# Patient Record
Sex: Male | Born: 1935 | Race: Black or African American | Hispanic: No
Health system: Southern US, Community
[De-identification: ages and names within clinical notes are randomized; demographics above are authoritative.]

## PROBLEM LIST (undated history)

## (undated) DIAGNOSIS — I739 Peripheral vascular disease, unspecified: Secondary | ICD-10-CM

## (undated) DIAGNOSIS — K5730 Diverticulosis of large intestine without perforation or abscess without bleeding: Secondary | ICD-10-CM

## (undated) DIAGNOSIS — N189 Chronic kidney disease, unspecified: Secondary | ICD-10-CM

## (undated) DIAGNOSIS — I639 Cerebral infarction, unspecified: Secondary | ICD-10-CM

## (undated) DIAGNOSIS — Z86010 Personal history of colonic polyps: Secondary | ICD-10-CM

## (undated) DIAGNOSIS — N400 Enlarged prostate without lower urinary tract symptoms: Secondary | ICD-10-CM

## (undated) DIAGNOSIS — I10 Essential (primary) hypertension: Secondary | ICD-10-CM

## (undated) DIAGNOSIS — I4891 Unspecified atrial fibrillation: Secondary | ICD-10-CM

## (undated) DIAGNOSIS — I2510 Atherosclerotic heart disease of native coronary artery without angina pectoris: Secondary | ICD-10-CM

## (undated) DIAGNOSIS — Z860101 Personal history of adenomatous and serrated colon polyps: Secondary | ICD-10-CM

## (undated) DIAGNOSIS — E785 Hyperlipidemia, unspecified: Secondary | ICD-10-CM

## (undated) DIAGNOSIS — I255 Ischemic cardiomyopathy: Secondary | ICD-10-CM

## (undated) DIAGNOSIS — I219 Acute myocardial infarction, unspecified: Secondary | ICD-10-CM

## (undated) DIAGNOSIS — L0390 Cellulitis, unspecified: Secondary | ICD-10-CM

## (undated) DIAGNOSIS — I872 Venous insufficiency (chronic) (peripheral): Secondary | ICD-10-CM

## (undated) DIAGNOSIS — I509 Heart failure, unspecified: Secondary | ICD-10-CM

## (undated) HISTORY — DX: Personal history of adenomatous and serrated colon polyps: Z86.0101

## (undated) HISTORY — DX: Venous insufficiency (chronic) (peripheral): I87.2

## (undated) HISTORY — DX: Ischemic cardiomyopathy: I25.5

## (undated) HISTORY — DX: Heart failure, unspecified: I50.9

## (undated) HISTORY — DX: Unspecified atrial fibrillation: I48.91

## (undated) HISTORY — DX: Diverticulosis of large intestine without perforation or abscess without bleeding: K57.30

## (undated) HISTORY — DX: Cellulitis, unspecified: L03.90

## (undated) HISTORY — DX: Essential (primary) hypertension: I10

## (undated) HISTORY — DX: Hyperlipidemia, unspecified: E78.5

## (undated) HISTORY — DX: Peripheral vascular disease, unspecified: I73.9

## (undated) HISTORY — DX: Acute myocardial infarction, unspecified: I21.9

## (undated) HISTORY — DX: Atherosclerotic heart disease of native coronary artery without angina pectoris: I25.10

## (undated) HISTORY — DX: Benign prostatic hyperplasia without lower urinary tract symptoms: N40.0

## (undated) HISTORY — DX: Personal history of colonic polyps: Z86.010

## (undated) HISTORY — DX: Chronic kidney disease, unspecified: N18.9

## (undated) HISTORY — PX: OTHER SURGICAL HISTORY: SHX169

## (undated) HISTORY — DX: Cerebral infarction, unspecified: I63.9

---

## 1987-07-21 HISTORY — PX: OTHER SURGICAL HISTORY: SHX169

## 2000-12-20 ENCOUNTER — Encounter: Payer: Self-pay | Admitting: Family Medicine

## 2000-12-20 ENCOUNTER — Encounter: Admission: RE | Admit: 2000-12-20 | Discharge: 2000-12-20 | Payer: Self-pay | Admitting: Family Medicine

## 2001-01-28 ENCOUNTER — Encounter: Admission: RE | Admit: 2001-01-28 | Discharge: 2001-01-28 | Payer: Self-pay | Admitting: Family Medicine

## 2001-01-28 ENCOUNTER — Encounter: Payer: Self-pay | Admitting: Family Medicine

## 2001-07-30 ENCOUNTER — Encounter: Payer: Self-pay | Admitting: Cardiology

## 2001-07-30 ENCOUNTER — Inpatient Hospital Stay (HOSPITAL_COMMUNITY): Admission: EM | Admit: 2001-07-30 | Discharge: 2001-08-02 | Payer: Self-pay | Admitting: Emergency Medicine

## 2001-08-01 ENCOUNTER — Encounter: Payer: Self-pay | Admitting: Internal Medicine

## 2002-12-26 ENCOUNTER — Encounter (INDEPENDENT_AMBULATORY_CARE_PROVIDER_SITE_OTHER): Payer: Self-pay | Admitting: Gastroenterology

## 2002-12-26 DIAGNOSIS — D126 Benign neoplasm of colon, unspecified: Secondary | ICD-10-CM | POA: Insufficient documentation

## 2002-12-26 DIAGNOSIS — K5730 Diverticulosis of large intestine without perforation or abscess without bleeding: Secondary | ICD-10-CM | POA: Insufficient documentation

## 2003-06-01 ENCOUNTER — Ambulatory Visit (HOSPITAL_COMMUNITY): Admission: RE | Admit: 2003-06-01 | Discharge: 2003-06-01 | Payer: Self-pay | Admitting: Family Medicine

## 2005-01-26 ENCOUNTER — Ambulatory Visit: Payer: Self-pay | Admitting: Gastroenterology

## 2005-02-05 ENCOUNTER — Ambulatory Visit: Payer: Self-pay | Admitting: Cardiology

## 2005-02-18 ENCOUNTER — Ambulatory Visit: Payer: Self-pay | Admitting: Cardiology

## 2005-05-25 ENCOUNTER — Inpatient Hospital Stay (HOSPITAL_COMMUNITY): Admission: EM | Admit: 2005-05-25 | Discharge: 2005-06-04 | Payer: Self-pay | Admitting: Emergency Medicine

## 2005-05-28 ENCOUNTER — Ambulatory Visit: Payer: Self-pay | Admitting: *Deleted

## 2005-06-04 ENCOUNTER — Inpatient Hospital Stay: Admission: RE | Admit: 2005-06-04 | Discharge: 2005-06-16 | Payer: Self-pay | Admitting: Cardiology

## 2005-06-24 ENCOUNTER — Ambulatory Visit: Payer: Self-pay | Admitting: *Deleted

## 2005-06-29 ENCOUNTER — Ambulatory Visit: Payer: Self-pay | Admitting: Cardiology

## 2005-07-02 ENCOUNTER — Ambulatory Visit: Payer: Self-pay | Admitting: *Deleted

## 2005-07-09 ENCOUNTER — Ambulatory Visit: Payer: Self-pay | Admitting: Cardiology

## 2005-07-17 ENCOUNTER — Ambulatory Visit: Payer: Self-pay | Admitting: Cardiology

## 2005-07-31 ENCOUNTER — Ambulatory Visit: Payer: Self-pay | Admitting: Internal Medicine

## 2005-08-14 ENCOUNTER — Ambulatory Visit: Payer: Self-pay | Admitting: Cardiology

## 2005-09-04 ENCOUNTER — Ambulatory Visit: Payer: Self-pay | Admitting: Cardiovascular Disease

## 2005-09-16 ENCOUNTER — Ambulatory Visit: Payer: Self-pay | Admitting: Cardiology

## 2005-09-16 ENCOUNTER — Ambulatory Visit: Payer: Self-pay | Admitting: Internal Medicine

## 2005-09-23 ENCOUNTER — Ambulatory Visit: Payer: Self-pay | Admitting: Internal Medicine

## 2005-10-07 ENCOUNTER — Ambulatory Visit: Payer: Self-pay | Admitting: Cardiology

## 2005-11-04 ENCOUNTER — Ambulatory Visit: Payer: Self-pay | Admitting: *Deleted

## 2005-12-02 ENCOUNTER — Ambulatory Visit: Payer: Self-pay | Admitting: Cardiology

## 2005-12-30 ENCOUNTER — Ambulatory Visit: Payer: Self-pay | Admitting: Cardiovascular Disease

## 2006-01-27 ENCOUNTER — Ambulatory Visit: Payer: Self-pay | Admitting: Internal Medicine

## 2006-02-24 ENCOUNTER — Ambulatory Visit: Payer: Self-pay | Admitting: Cardiology

## 2006-03-18 ENCOUNTER — Ambulatory Visit: Payer: Self-pay | Admitting: Cardiology

## 2006-03-24 ENCOUNTER — Ambulatory Visit: Payer: Self-pay | Admitting: *Deleted

## 2006-04-01 ENCOUNTER — Encounter: Payer: Self-pay | Admitting: Cardiology

## 2006-04-01 ENCOUNTER — Encounter: Payer: Self-pay | Admitting: Internal Medicine

## 2006-04-01 ENCOUNTER — Ambulatory Visit: Payer: Self-pay

## 2006-04-21 ENCOUNTER — Ambulatory Visit: Payer: Self-pay | Admitting: Internal Medicine

## 2006-05-05 ENCOUNTER — Ambulatory Visit: Payer: Self-pay | Admitting: *Deleted

## 2006-06-01 ENCOUNTER — Ambulatory Visit: Payer: Self-pay | Admitting: *Deleted

## 2006-06-03 ENCOUNTER — Ambulatory Visit: Payer: Self-pay | Admitting: Cardiology

## 2006-06-04 ENCOUNTER — Ambulatory Visit: Payer: Self-pay | Admitting: Cardiovascular Disease

## 2006-06-09 ENCOUNTER — Ambulatory Visit: Payer: Self-pay | Admitting: Cardiology

## 2006-06-16 ENCOUNTER — Ambulatory Visit: Payer: Self-pay | Admitting: Cardiovascular Disease

## 2006-06-22 ENCOUNTER — Ambulatory Visit: Payer: Self-pay | Admitting: Cardiology

## 2006-07-01 ENCOUNTER — Ambulatory Visit: Payer: Self-pay | Admitting: Cardiology

## 2006-07-06 ENCOUNTER — Ambulatory Visit: Payer: Self-pay | Admitting: Cardiology

## 2006-07-21 ENCOUNTER — Ambulatory Visit: Payer: Self-pay | Admitting: *Deleted

## 2006-08-02 ENCOUNTER — Emergency Department (HOSPITAL_COMMUNITY): Admission: EM | Admit: 2006-08-02 | Discharge: 2006-08-02 | Payer: Self-pay | Admitting: Emergency Medicine

## 2006-08-09 ENCOUNTER — Ambulatory Visit: Payer: Self-pay | Admitting: Cardiology

## 2006-08-18 ENCOUNTER — Ambulatory Visit: Payer: Self-pay | Admitting: Internal Medicine

## 2006-08-30 ENCOUNTER — Ambulatory Visit: Payer: Self-pay | Admitting: Cardiology

## 2006-09-07 ENCOUNTER — Encounter: Admission: RE | Admit: 2006-09-07 | Discharge: 2006-09-07 | Payer: Self-pay | Admitting: Family Medicine

## 2006-09-17 ENCOUNTER — Ambulatory Visit: Payer: Self-pay | Admitting: Cardiology

## 2006-09-27 ENCOUNTER — Ambulatory Visit: Payer: Self-pay | Admitting: Cardiology

## 2006-10-18 ENCOUNTER — Ambulatory Visit: Payer: Self-pay | Admitting: Cardiovascular Disease

## 2006-11-04 ENCOUNTER — Ambulatory Visit: Payer: Self-pay | Admitting: Cardiology

## 2006-11-04 ENCOUNTER — Ambulatory Visit: Payer: Self-pay | Admitting: Internal Medicine

## 2006-11-11 ENCOUNTER — Ambulatory Visit: Payer: Self-pay

## 2006-11-18 ENCOUNTER — Ambulatory Visit: Payer: Self-pay | Admitting: Cardiovascular Disease

## 2006-12-01 ENCOUNTER — Ambulatory Visit: Payer: Self-pay | Admitting: Internal Medicine

## 2006-12-16 ENCOUNTER — Ambulatory Visit: Payer: Self-pay | Admitting: *Deleted

## 2006-12-30 ENCOUNTER — Ambulatory Visit: Payer: Self-pay | Admitting: Cardiology

## 2007-01-10 ENCOUNTER — Ambulatory Visit: Payer: Self-pay | Admitting: Internal Medicine

## 2007-01-20 ENCOUNTER — Ambulatory Visit: Payer: Self-pay | Admitting: Internal Medicine

## 2007-02-03 ENCOUNTER — Ambulatory Visit: Payer: Self-pay | Admitting: Cardiology

## 2007-02-24 ENCOUNTER — Ambulatory Visit: Payer: Self-pay | Admitting: Cardiovascular Disease

## 2007-03-10 ENCOUNTER — Ambulatory Visit: Payer: Self-pay | Admitting: Cardiology

## 2007-03-31 ENCOUNTER — Ambulatory Visit: Payer: Self-pay | Admitting: Cardiology

## 2007-04-20 ENCOUNTER — Ambulatory Visit: Payer: Self-pay | Admitting: Cardiology

## 2007-04-28 ENCOUNTER — Ambulatory Visit: Payer: Self-pay | Admitting: Cardiology

## 2007-04-28 LAB — CONVERTED CEMR LAB
BUN: 15 mg/dL (ref 6–23)
Basophils Absolute: 0 10*3/uL (ref 0.0–0.1)
Basophils Relative: 0.6 % (ref 0.0–1.0)
CO2: 30 meq/L (ref 19–32)
Calcium: 9.2 mg/dL (ref 8.4–10.5)
Chloride: 107 meq/L (ref 96–112)
Cholesterol: 146 mg/dL (ref 0–200)
Creatinine, Ser: 1.6 mg/dL — ABNORMAL HIGH (ref 0.4–1.5)
Eosinophils Absolute: 0.3 10*3/uL (ref 0.0–0.6)
Eosinophils Relative: 3.8 % (ref 0.0–5.0)
GFR calc Af Amer: 54 mL/min
GFR calc non Af Amer: 45 mL/min
Glucose, Bld: 97 mg/dL (ref 70–99)
HCT: 42.5 % (ref 39.0–52.0)
HDL: 55 mg/dL (ref >39.0)
Hemoglobin: 14.2 g/dL (ref 13.0–17.0)
LDL Cholesterol: 78 mg/dL (ref 0–99)
Lymphocytes Relative: 30.1 % (ref 12.0–46.0)
MCHC: 33.3 g/dL (ref 30.0–36.0)
MCV: 84.1 fL (ref 78.0–100.0)
Monocytes Absolute: 0.7 10*3/uL (ref 0.2–0.7)
Monocytes Relative: 10.3 % (ref 3.0–11.0)
Neutro Abs: 4 10*3/uL (ref 1.4–7.7)
Neutrophils Relative %: 55.2 % (ref 43.0–77.0)
Platelets: 170 10*3/uL (ref 150–400)
Potassium: 3.8 meq/L (ref 3.5–5.1)
RBC: 5.06 M/uL (ref 4.22–5.81)
RDW: 13.3 % (ref 11.5–14.6)
Sodium: 143 meq/L (ref 135–145)
TSH: 0.96 microintl units/mL (ref 0.35–5.50)
Total CHOL/HDL Ratio: 2.7
Triglycerides: 67 mg/dL (ref 0–149)
VLDL: 13 mg/dL (ref 0–40)
WBC: 7.1 10*3/uL (ref 4.5–10.5)

## 2007-05-11 ENCOUNTER — Ambulatory Visit: Payer: Self-pay | Admitting: Cardiology

## 2007-06-08 ENCOUNTER — Ambulatory Visit: Payer: Self-pay | Admitting: Cardiology

## 2007-07-06 ENCOUNTER — Ambulatory Visit: Payer: Self-pay | Admitting: Cardiovascular Disease

## 2007-08-03 ENCOUNTER — Ambulatory Visit: Payer: Self-pay | Admitting: Cardiovascular Disease

## 2007-08-31 ENCOUNTER — Ambulatory Visit: Payer: Self-pay | Admitting: Cardiology

## 2007-09-28 ENCOUNTER — Ambulatory Visit: Payer: Self-pay | Admitting: Cardiology

## 2007-10-20 ENCOUNTER — Ambulatory Visit: Payer: Self-pay | Admitting: Cardiology

## 2007-10-26 ENCOUNTER — Ambulatory Visit: Payer: Self-pay | Admitting: Cardiovascular Disease

## 2007-11-01 ENCOUNTER — Ambulatory Visit: Payer: Self-pay

## 2007-11-01 ENCOUNTER — Encounter: Payer: Self-pay | Admitting: Cardiology

## 2007-11-01 ENCOUNTER — Ambulatory Visit: Payer: Self-pay | Admitting: Cardiology

## 2007-11-01 LAB — CONVERTED CEMR LAB
ALT: 53 units/L (ref 0–53)
AST: 49 units/L — ABNORMAL HIGH (ref 0–37)
Albumin: 3.4 g/dL — ABNORMAL LOW (ref 3.5–5.2)
Alkaline Phosphatase: 75 units/L (ref 39–117)
BUN: 21 mg/dL (ref 6–23)
Basophils Absolute: 0 10*3/uL (ref 0.0–0.1)
Basophils Relative: 0.4 % (ref 0.0–1.0)
Bilirubin, Direct: 0.1 mg/dL (ref 0.0–0.3)
CO2: 33 meq/L — ABNORMAL HIGH (ref 19–32)
Calcium: 9.5 mg/dL (ref 8.4–10.5)
Chloride: 105 meq/L (ref 96–112)
Cholesterol: 141 mg/dL (ref 0–200)
Creatinine, Ser: 1.9 mg/dL — ABNORMAL HIGH (ref 0.4–1.5)
Eosinophils Absolute: 0.2 10*3/uL (ref 0.0–0.7)
Eosinophils Relative: 2.5 % (ref 0.0–5.0)
GFR calc Af Amer: 44 mL/min
GFR calc non Af Amer: 37 mL/min
Glucose, Bld: 96 mg/dL (ref 70–99)
HCT: 45.9 % (ref 39.0–52.0)
HDL: 55.6 mg/dL (ref >39.0)
Hemoglobin: 14.7 g/dL (ref 13.0–17.0)
LDL Cholesterol: 73 mg/dL (ref 0–99)
Lymphocytes Relative: 24.7 % (ref 12.0–46.0)
MCHC: 32.1 g/dL (ref 30.0–36.0)
MCV: 84.4 fL (ref 78.0–100.0)
Monocytes Absolute: 0.8 10*3/uL (ref 0.1–1.0)
Monocytes Relative: 9.5 % (ref 3.0–12.0)
Neutro Abs: 5.1 10*3/uL (ref 1.4–7.7)
Neutrophils Relative %: 62.9 % (ref 43.0–77.0)
Platelets: 160 10*3/uL (ref 150–400)
Potassium: 4.4 meq/L (ref 3.5–5.1)
Pro B Natriuretic peptide (BNP): 185 pg/mL — ABNORMAL HIGH (ref 0.0–100.0)
RBC: 5.44 M/uL (ref 4.22–5.81)
RDW: 13.3 % (ref 11.5–14.6)
Sodium: 142 meq/L (ref 135–145)
TSH: 0.68 microintl units/mL (ref 0.35–5.50)
Total Bilirubin: 0.7 mg/dL (ref 0.3–1.2)
Total CHOL/HDL Ratio: 2.5
Total Protein: 7.4 g/dL (ref 6.0–8.3)
Triglycerides: 61 mg/dL (ref 0–149)
VLDL: 12 mg/dL (ref 0–40)
WBC: 8.1 10*3/uL (ref 4.5–10.5)

## 2007-11-11 DIAGNOSIS — I872 Venous insufficiency (chronic) (peripheral): Secondary | ICD-10-CM | POA: Insufficient documentation

## 2007-11-11 DIAGNOSIS — Z8679 Personal history of other diseases of the circulatory system: Secondary | ICD-10-CM

## 2007-11-11 DIAGNOSIS — IMO0002 Reserved for concepts with insufficient information to code with codable children: Secondary | ICD-10-CM | POA: Insufficient documentation

## 2007-11-11 DIAGNOSIS — E785 Hyperlipidemia, unspecified: Secondary | ICD-10-CM

## 2007-11-11 DIAGNOSIS — I10 Essential (primary) hypertension: Secondary | ICD-10-CM | POA: Insufficient documentation

## 2007-11-11 DIAGNOSIS — I5032 Chronic diastolic (congestive) heart failure: Secondary | ICD-10-CM

## 2007-11-11 DIAGNOSIS — Z87448 Personal history of other diseases of urinary system: Secondary | ICD-10-CM | POA: Insufficient documentation

## 2007-11-11 DIAGNOSIS — I219 Acute myocardial infarction, unspecified: Secondary | ICD-10-CM | POA: Insufficient documentation

## 2007-11-23 ENCOUNTER — Ambulatory Visit: Payer: Self-pay | Admitting: Internal Medicine

## 2007-12-06 ENCOUNTER — Ambulatory Visit: Payer: Self-pay | Admitting: Cardiology

## 2007-12-16 ENCOUNTER — Ambulatory Visit: Payer: Self-pay | Admitting: Cardiology

## 2007-12-16 ENCOUNTER — Ambulatory Visit: Payer: Self-pay

## 2007-12-16 LAB — CONVERTED CEMR LAB
Albumin: 3.2 g/dL — ABNORMAL LOW (ref 3.5–5.2)
Bilirubin Urine: NEGATIVE
Hemoglobin, Urine: NEGATIVE
Ketones, ur: NEGATIVE mg/dL
Leukocytes, UA: NEGATIVE
Nitrite: NEGATIVE
Pro B Natriuretic peptide (BNP): 161 pg/mL — ABNORMAL HIGH (ref 0.0–100.0)
Specific Gravity, Urine: 1.025 (ref 1.000–1.03)
Total Protein, Urine: NEGATIVE mg/dL
Urine Glucose: NEGATIVE mg/dL
Urobilinogen, UA: 0.2 (ref 0.0–1.0)
pH: 6 (ref 5.0–8.0)

## 2007-12-21 ENCOUNTER — Ambulatory Visit: Payer: Self-pay | Admitting: Cardiology

## 2008-01-04 ENCOUNTER — Ambulatory Visit: Payer: Self-pay | Admitting: Internal Medicine

## 2008-01-17 ENCOUNTER — Ambulatory Visit: Payer: Self-pay | Admitting: Cardiology

## 2008-01-24 ENCOUNTER — Ambulatory Visit: Payer: Self-pay | Admitting: Cardiology

## 2008-02-01 ENCOUNTER — Ambulatory Visit: Payer: Self-pay | Admitting: Cardiology

## 2008-02-17 ENCOUNTER — Ambulatory Visit: Payer: Self-pay | Admitting: Cardiology

## 2008-02-27 ENCOUNTER — Ambulatory Visit: Payer: Self-pay | Admitting: Cardiology

## 2008-03-12 ENCOUNTER — Ambulatory Visit: Payer: Self-pay | Admitting: Internal Medicine

## 2008-04-09 ENCOUNTER — Ambulatory Visit: Payer: Self-pay | Admitting: Cardiovascular Disease

## 2008-05-07 ENCOUNTER — Ambulatory Visit: Payer: Self-pay | Admitting: Internal Medicine

## 2008-05-13 ENCOUNTER — Encounter: Payer: Self-pay | Admitting: Cardiology

## 2008-05-13 DIAGNOSIS — Z8673 Personal history of transient ischemic attack (TIA), and cerebral infarction without residual deficits: Secondary | ICD-10-CM

## 2008-05-13 DIAGNOSIS — N183 Chronic kidney disease, stage 3 unspecified: Secondary | ICD-10-CM | POA: Insufficient documentation

## 2008-05-13 DIAGNOSIS — I2510 Atherosclerotic heart disease of native coronary artery without angina pectoris: Secondary | ICD-10-CM

## 2008-05-28 ENCOUNTER — Ambulatory Visit: Payer: Self-pay | Admitting: Cardiology

## 2008-06-25 ENCOUNTER — Ambulatory Visit: Payer: Self-pay | Admitting: Cardiovascular Disease

## 2008-07-23 ENCOUNTER — Ambulatory Visit: Payer: Self-pay | Admitting: Internal Medicine

## 2008-08-20 ENCOUNTER — Ambulatory Visit: Payer: Self-pay | Admitting: Cardiology

## 2008-09-17 ENCOUNTER — Ambulatory Visit: Payer: Self-pay | Admitting: Cardiology

## 2008-10-15 ENCOUNTER — Ambulatory Visit: Payer: Self-pay | Admitting: Cardiology

## 2008-11-13 ENCOUNTER — Ambulatory Visit: Payer: Self-pay | Admitting: Cardiology

## 2008-11-26 ENCOUNTER — Telehealth: Payer: Self-pay | Admitting: Cardiology

## 2008-12-11 ENCOUNTER — Ambulatory Visit: Payer: Self-pay | Admitting: Cardiology

## 2008-12-18 ENCOUNTER — Encounter: Payer: Self-pay | Admitting: *Deleted

## 2009-01-08 ENCOUNTER — Ambulatory Visit: Payer: Self-pay | Admitting: Cardiology

## 2009-01-08 ENCOUNTER — Encounter (INDEPENDENT_AMBULATORY_CARE_PROVIDER_SITE_OTHER): Payer: Self-pay | Admitting: Cardiology

## 2009-01-08 LAB — CONVERTED CEMR LAB
POC INR: 1.9
Prothrombin Time: 17 s

## 2009-01-23 ENCOUNTER — Encounter: Payer: Self-pay | Admitting: *Deleted

## 2009-01-29 ENCOUNTER — Ambulatory Visit: Payer: Self-pay | Admitting: Cardiology

## 2009-01-30 ENCOUNTER — Telehealth: Payer: Self-pay | Admitting: Cardiology

## 2009-02-01 ENCOUNTER — Ambulatory Visit: Payer: Self-pay | Admitting: Cardiology

## 2009-02-04 LAB — CONVERTED CEMR LAB
ALT: 53 units/L (ref 0–53)
AST: 62 units/L — ABNORMAL HIGH (ref 0–37)
Albumin: 3.1 g/dL — ABNORMAL LOW (ref 3.5–5.2)
Alkaline Phosphatase: 60 units/L (ref 39–117)
BUN: 24 mg/dL — ABNORMAL HIGH (ref 6–23)
Basophils Absolute: 0 10*3/uL (ref 0.0–0.1)
Basophils Relative: 0.1 % (ref 0.0–3.0)
Bilirubin, Direct: 0.1 mg/dL (ref 0.0–0.3)
CO2: 27 meq/L (ref 19–32)
Calcium: 8.8 mg/dL (ref 8.4–10.5)
Chloride: 112 meq/L (ref 96–112)
Cholesterol: 127 mg/dL (ref 0–200)
Creatinine, Ser: 1.6 mg/dL — ABNORMAL HIGH (ref 0.4–1.5)
Eosinophils Absolute: 0.2 10*3/uL (ref 0.0–0.7)
Eosinophils Relative: 2 % (ref 0.0–5.0)
GFR calc non Af Amer: 53.78 mL/min (ref >60)
Glucose, Bld: 92 mg/dL (ref 70–99)
HCT: 43.9 % (ref 39.0–52.0)
HDL: 53.7 mg/dL (ref >39.00)
Hemoglobin: 14.7 g/dL (ref 13.0–17.0)
LDL Cholesterol: 61 mg/dL (ref 0–99)
Lymphocytes Relative: 21.7 % (ref 12.0–46.0)
Lymphs Abs: 1.8 10*3/uL (ref 0.7–4.0)
MCHC: 33.4 g/dL (ref 30.0–36.0)
MCV: 83.9 fL (ref 78.0–100.0)
Monocytes Absolute: 0.7 10*3/uL (ref 0.1–1.0)
Monocytes Relative: 9 % (ref 3.0–12.0)
Neutro Abs: 5.5 10*3/uL (ref 1.4–7.7)
Neutrophils Relative %: 67.2 % (ref 43.0–77.0)
Platelets: 170 10*3/uL (ref 150.0–400.0)
Potassium: 3.8 meq/L (ref 3.5–5.1)
RBC: 5.24 M/uL (ref 4.22–5.81)
RDW: 13.7 % (ref 11.5–14.6)
Sodium: 143 meq/L (ref 135–145)
TSH: 0.57 microintl units/mL (ref 0.35–5.50)
Total Bilirubin: 0.7 mg/dL (ref 0.3–1.2)
Total CHOL/HDL Ratio: 2
Total Protein: 7 g/dL (ref 6.0–8.3)
Triglycerides: 64 mg/dL (ref 0.0–149.0)
VLDL: 12.8 mg/dL (ref 0.0–40.0)
WBC: 8.2 10*3/uL (ref 4.5–10.5)

## 2009-02-06 ENCOUNTER — Ambulatory Visit: Payer: Self-pay | Admitting: Cardiology

## 2009-02-06 LAB — CONVERTED CEMR LAB
POC INR: 2.1
Prothrombin Time: 17.9 s

## 2009-02-11 ENCOUNTER — Encounter: Payer: Self-pay | Admitting: Cardiology

## 2009-02-18 ENCOUNTER — Telehealth (INDEPENDENT_AMBULATORY_CARE_PROVIDER_SITE_OTHER): Payer: Self-pay | Admitting: *Deleted

## 2009-02-26 ENCOUNTER — Encounter: Payer: Self-pay | Admitting: Cardiology

## 2009-03-06 ENCOUNTER — Ambulatory Visit: Payer: Self-pay | Admitting: Cardiology

## 2009-03-06 LAB — CONVERTED CEMR LAB: POC INR: 2.2

## 2009-03-18 ENCOUNTER — Telehealth: Payer: Self-pay | Admitting: Cardiology

## 2009-04-03 ENCOUNTER — Ambulatory Visit: Payer: Self-pay | Admitting: Internal Medicine

## 2009-04-03 LAB — CONVERTED CEMR LAB: POC INR: 2.6

## 2009-04-15 ENCOUNTER — Telehealth (INDEPENDENT_AMBULATORY_CARE_PROVIDER_SITE_OTHER): Payer: Self-pay | Admitting: *Deleted

## 2009-05-01 ENCOUNTER — Ambulatory Visit: Payer: Self-pay | Admitting: Internal Medicine

## 2009-05-01 LAB — CONVERTED CEMR LAB: POC INR: 2

## 2009-05-29 ENCOUNTER — Ambulatory Visit: Payer: Self-pay | Admitting: Internal Medicine

## 2009-05-29 LAB — CONVERTED CEMR LAB: POC INR: 2.3

## 2009-06-26 ENCOUNTER — Ambulatory Visit: Payer: Self-pay | Admitting: Cardiology

## 2009-06-26 LAB — CONVERTED CEMR LAB: POC INR: 2.3

## 2009-07-10 ENCOUNTER — Encounter: Payer: Self-pay | Admitting: Cardiology

## 2009-07-10 ENCOUNTER — Telehealth: Payer: Self-pay | Admitting: Internal Medicine

## 2009-07-11 ENCOUNTER — Telehealth (INDEPENDENT_AMBULATORY_CARE_PROVIDER_SITE_OTHER): Payer: Self-pay | Admitting: *Deleted

## 2009-07-16 ENCOUNTER — Ambulatory Visit: Payer: Self-pay | Admitting: Cardiology

## 2009-07-18 LAB — CONVERTED CEMR LAB
BUN: 24 mg/dL — ABNORMAL HIGH (ref 6–23)
CO2: 20 meq/L (ref 19–32)
Calcium: 8.9 mg/dL (ref 8.4–10.5)
Chloride: 115 meq/L — ABNORMAL HIGH (ref 96–112)
Creatinine, Ser: 1.9 mg/dL — ABNORMAL HIGH (ref 0.4–1.5)
GFR calc non Af Amer: 44.06 mL/min (ref >60)
Glucose, Bld: 96 mg/dL (ref 70–99)
Potassium: 4.3 meq/L (ref 3.5–5.1)
Sodium: 141 meq/L (ref 135–145)

## 2009-07-22 ENCOUNTER — Encounter: Payer: Self-pay | Admitting: Cardiology

## 2009-07-24 ENCOUNTER — Ambulatory Visit: Payer: Self-pay | Admitting: Cardiovascular Disease

## 2009-07-24 LAB — CONVERTED CEMR LAB: POC INR: 2.1

## 2009-07-29 ENCOUNTER — Telehealth: Payer: Self-pay | Admitting: Cardiology

## 2009-08-22 ENCOUNTER — Ambulatory Visit: Payer: Self-pay | Admitting: Cardiology

## 2009-08-22 LAB — CONVERTED CEMR LAB: POC INR: 1.8

## 2009-09-19 ENCOUNTER — Ambulatory Visit: Payer: Self-pay | Admitting: Cardiology

## 2009-09-19 LAB — CONVERTED CEMR LAB: POC INR: 2.7

## 2009-10-18 ENCOUNTER — Ambulatory Visit: Payer: Self-pay | Admitting: Internal Medicine

## 2009-10-18 LAB — CONVERTED CEMR LAB: POC INR: 2.3

## 2009-11-14 ENCOUNTER — Telehealth: Payer: Self-pay | Admitting: Cardiology

## 2009-11-15 ENCOUNTER — Ambulatory Visit: Payer: Self-pay | Admitting: Cardiology

## 2009-11-15 LAB — CONVERTED CEMR LAB: POC INR: 1.6

## 2009-12-06 ENCOUNTER — Ambulatory Visit: Payer: Self-pay | Admitting: Internal Medicine

## 2009-12-06 LAB — CONVERTED CEMR LAB: POC INR: 1.6

## 2009-12-20 ENCOUNTER — Ambulatory Visit: Payer: Self-pay | Admitting: Cardiovascular Disease

## 2009-12-20 LAB — CONVERTED CEMR LAB: POC INR: 1.7

## 2010-01-03 ENCOUNTER — Ambulatory Visit: Payer: Self-pay | Admitting: Cardiology

## 2010-01-03 LAB — CONVERTED CEMR LAB: POC INR: 1.6

## 2010-01-06 ENCOUNTER — Telehealth: Payer: Self-pay | Admitting: Cardiology

## 2010-01-13 ENCOUNTER — Ambulatory Visit: Payer: Self-pay | Admitting: Cardiology

## 2010-01-13 LAB — CONVERTED CEMR LAB
INR: 1.9
POC INR: 1.9

## 2010-01-27 ENCOUNTER — Ambulatory Visit: Payer: Self-pay | Admitting: Internal Medicine

## 2010-01-27 ENCOUNTER — Ambulatory Visit: Payer: Self-pay | Admitting: Cardiology

## 2010-01-27 LAB — CONVERTED CEMR LAB: POC INR: 2.3

## 2010-02-17 ENCOUNTER — Ambulatory Visit: Payer: Self-pay | Admitting: Cardiovascular Disease

## 2010-02-17 LAB — CONVERTED CEMR LAB: POC INR: 1.8

## 2010-02-26 ENCOUNTER — Ambulatory Visit: Payer: Self-pay | Admitting: Cardiology

## 2010-03-05 LAB — CONVERTED CEMR LAB
ALT: 76 units/L — ABNORMAL HIGH (ref 0–53)
AST: 103 units/L — ABNORMAL HIGH (ref 0–37)
Albumin: 2.9 g/dL — ABNORMAL LOW (ref 3.5–5.2)
Alkaline Phosphatase: 72 units/L (ref 39–117)
BUN: 16 mg/dL (ref 6–23)
Basophils Absolute: 0.1 10*3/uL (ref 0.0–0.1)
Basophils Relative: 0.8 % (ref 0.0–3.0)
Bilirubin, Direct: 0.3 mg/dL (ref 0.0–0.3)
CO2: 28 meq/L (ref 19–32)
Calcium: 8.9 mg/dL (ref 8.4–10.5)
Chloride: 107 meq/L (ref 96–112)
Cholesterol: 127 mg/dL (ref 0–200)
Creatinine, Ser: 1.5 mg/dL (ref 0.4–1.5)
Eosinophils Absolute: 0.3 10*3/uL (ref 0.0–0.7)
Eosinophils Relative: 4 % (ref 0.0–5.0)
GFR calc non Af Amer: 60.09 mL/min (ref >60)
Glucose, Bld: 81 mg/dL (ref 70–99)
HCT: 42.3 % (ref 39.0–52.0)
HDL: 41.4 mg/dL (ref >39.00)
Hemoglobin: 14.1 g/dL (ref 13.0–17.0)
LDL Cholesterol: 72 mg/dL (ref 0–99)
Lymphocytes Relative: 31.1 % (ref 12.0–46.0)
Lymphs Abs: 2.2 10*3/uL (ref 0.7–4.0)
MCHC: 33.2 g/dL (ref 30.0–36.0)
MCV: 86.2 fL (ref 78.0–100.0)
Monocytes Absolute: 0.7 10*3/uL (ref 0.1–1.0)
Monocytes Relative: 10.1 % (ref 3.0–12.0)
Neutro Abs: 3.8 10*3/uL (ref 1.4–7.7)
Neutrophils Relative %: 54 % (ref 43.0–77.0)
Platelets: 152 10*3/uL (ref 150.0–400.0)
Potassium: 3.8 meq/L (ref 3.5–5.1)
RBC: 4.91 M/uL (ref 4.22–5.81)
RDW: 14.9 % — ABNORMAL HIGH (ref 11.5–14.6)
Sodium: 142 meq/L (ref 135–145)
TSH: 0.94 microintl units/mL (ref 0.35–5.50)
Total Bilirubin: 0.8 mg/dL (ref 0.3–1.2)
Total CHOL/HDL Ratio: 3
Total Protein: 6.4 g/dL (ref 6.0–8.3)
Triglycerides: 66 mg/dL (ref 0.0–149.0)
VLDL: 13.2 mg/dL (ref 0.0–40.0)
WBC: 7 10*3/uL (ref 4.5–10.5)

## 2010-03-10 ENCOUNTER — Ambulatory Visit: Payer: Self-pay | Admitting: Cardiology

## 2010-03-10 LAB — CONVERTED CEMR LAB: POC INR: 2.2

## 2010-04-04 ENCOUNTER — Ambulatory Visit: Payer: Self-pay | Admitting: Cardiology

## 2010-04-04 LAB — CONVERTED CEMR LAB: POC INR: 2.1

## 2010-04-08 LAB — CONVERTED CEMR LAB
ALT: 84 units/L — ABNORMAL HIGH (ref 0–53)
AST: 114 units/L — ABNORMAL HIGH (ref 0–37)
Albumin: 3.2 g/dL — ABNORMAL LOW (ref 3.5–5.2)
Alkaline Phosphatase: 79 units/L (ref 39–117)
Bilirubin, Direct: 0.3 mg/dL (ref 0.0–0.3)
Total Bilirubin: 0.6 mg/dL (ref 0.3–1.2)
Total Protein: 7 g/dL (ref 6.0–8.3)

## 2010-04-15 ENCOUNTER — Encounter
Admission: RE | Admit: 2010-04-15 | Discharge: 2010-07-09 | Payer: Self-pay | Source: Home / Self Care | Attending: Ophthalmology | Admitting: Ophthalmology

## 2010-04-24 ENCOUNTER — Ambulatory Visit: Payer: Self-pay | Admitting: Internal Medicine

## 2010-04-24 LAB — CONVERTED CEMR LAB: POC INR: 1.8

## 2010-05-19 ENCOUNTER — Encounter: Payer: Self-pay | Admitting: Cardiology

## 2010-05-22 ENCOUNTER — Ambulatory Visit: Payer: Self-pay | Admitting: Cardiology

## 2010-05-22 LAB — CONVERTED CEMR LAB: POC INR: 1.8

## 2010-06-10 ENCOUNTER — Telehealth: Payer: Self-pay | Admitting: Cardiology

## 2010-06-10 ENCOUNTER — Telehealth (INDEPENDENT_AMBULATORY_CARE_PROVIDER_SITE_OTHER): Payer: Self-pay | Admitting: *Deleted

## 2010-06-16 ENCOUNTER — Ambulatory Visit: Payer: Self-pay | Admitting: Cardiology

## 2010-06-16 LAB — CONVERTED CEMR LAB: POC INR: 1.9

## 2010-06-30 ENCOUNTER — Ambulatory Visit: Payer: Self-pay | Admitting: Cardiology

## 2010-06-30 LAB — CONVERTED CEMR LAB: POC INR: 2

## 2010-07-25 ENCOUNTER — Ambulatory Visit
Admission: RE | Admit: 2010-07-25 | Discharge: 2010-07-25 | Payer: Self-pay | Source: Home / Self Care | Attending: Cardiology | Admitting: Cardiology

## 2010-08-04 ENCOUNTER — Encounter (INDEPENDENT_AMBULATORY_CARE_PROVIDER_SITE_OTHER): Payer: Self-pay | Admitting: *Deleted

## 2010-08-08 ENCOUNTER — Ambulatory Visit
Admission: RE | Admit: 2010-08-08 | Discharge: 2010-08-08 | Payer: Self-pay | Source: Home / Self Care | Attending: Cardiovascular Disease | Admitting: Cardiovascular Disease

## 2010-08-08 ENCOUNTER — Other Ambulatory Visit: Payer: Self-pay | Admitting: Cardiovascular Disease

## 2010-08-08 LAB — LIPID PANEL
Cholesterol: 155 mg/dL (ref 0–200)
HDL: 51.6 mg/dL (ref >39.00)
LDL Cholesterol: 89 mg/dL (ref 0–99)
Total CHOL/HDL Ratio: 3
Triglycerides: 71 mg/dL (ref 0.0–149.0)
VLDL: 14.2 mg/dL (ref 0.0–40.0)

## 2010-08-08 LAB — HEPATIC FUNCTION PANEL
ALT: 47 U/L (ref 0–53)
AST: 64 U/L — ABNORMAL HIGH (ref 0–37)
Albumin: 3.2 g/dL — ABNORMAL LOW (ref 3.5–5.2)
Alkaline Phosphatase: 76 U/L (ref 39–117)
Bilirubin, Direct: 0.1 mg/dL (ref 0.0–0.3)
Total Bilirubin: 0.7 mg/dL (ref 0.3–1.2)
Total Protein: 7 g/dL (ref 6.0–8.3)

## 2010-08-12 ENCOUNTER — Telehealth (INDEPENDENT_AMBULATORY_CARE_PROVIDER_SITE_OTHER): Payer: Self-pay | Admitting: *Deleted

## 2010-08-19 NOTE — Medication Information (Signed)
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 no    Recent/future dental: no  Any missed doses?: no       Is patient compliant with meds? yes       Allergies: No Known Drug Allergies  Anticoagulation Management History:      The patient is taking warfarin and comes in today for a routine follow up visit.  Positive risk factors for bleeding include an age of 75 years or older, history of CVA/TIA, and presence of serious comorbidities.  The bleeding index is 'high risk'.  Positive CHADS2 values include History of CHF, History of HTN, Age > 56 years old, and Prior Stroke/CVA/TIA.  The start date was 06/16/2005.  His last INR was 1.9.  Anticoagulation responsible provider: Antoine Poche MD, Fayrene Fearing.  INR POC: 2.2.  Cuvette Lot#: [id number].  Exp: 04/2011.    Anticoagulation Management Assessment/Plan:      The patient's current anticoagulation dose is Warfarin sodium 2 mg tabs: Take by mouth as directed.  The target INR is 2.0-3.0.  The next INR is due 04/07/2010.  Anticoagulation instructions were given to patient.  Results were reviewed/authorized by Weston Brass, PharmD.  He was notified by Gweneth Fritter, PharmD Candidate.         Prior Anticoagulation Instructions: INR 1.8  Take 2 tablets today then resume same dose of 1 tablet every day except 1 1/2 tablets on Monday and Friday.  Recheck INR in 3 weeks.   Current Anticoagulation Instructions: INR 2.2  Continue taking 1 tablet (2mg ) every day except take 1.5 tablets (3mg ) on Mondays and Fridays.  Recheck  in 4 weeks.

## 2010-08-19 NOTE — Progress Notes (Signed)
Summary: Simvastatin dose chane  ---- Converted from flag ---- ---- 01/05/2010 2:27 PM, Lenoria Farrier, MD, [facility] wrote: Let me decide what to do when I see him.  In the meantime he can take half does simvistatin. BB  ---- 01/03/2010 3:22 PM, Weston Brass PharmD wrote: Pt is taking amiodarone 200 mg daily with simvastatin 20 mg daily.  Looks like he is due for OV this month.  New max dose of simvastatin with amiodarone is 10 mg daily. Please note.  Thanks! ------------------------------  Phone Note Outgoing Call   Call placed by: Weston Brass PharmD,  January 06, 2010 9:23 AM Call placed to: Patient Summary of Call: Spoke with pt.  Informed him to take 1/2 of simvastatin tablet daily until his appt with Dr. Juanda Chance.   Initial call taken by: Weston Brass PharmD,  January 06, 2010 9:23 AM

## 2010-08-19 NOTE — Medication Information (Signed)
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 no    Recent/future dental: no  Any missed doses?: no       Is patient compliant with meds? yes       Allergies: No Known Drug Allergies  Anticoagulation Management History:      The patient is taking warfarin and comes in today for a routine follow up visit.  Positive risk factors for bleeding include an age of 75 years or older, history of CVA/TIA, and presence of serious comorbidities.  The bleeding index is 'high risk'.  Positive CHADS2 values include History of CHF, History of HTN, Age > 61 years old, and Prior Stroke/CVA/TIA.  The start date was 06/16/2005.  Anticoagulation responsible provider: Jens Som MD, Arlys John.  INR POC: 2.7.  Cuvette Lot#: [id number].  Exp: 11/2010.    Anticoagulation Management Assessment/Plan:      The patient's current anticoagulation dose is Warfarin sodium 2 mg tabs: Take by mouth as directed.  The target INR is 2.0-3.0.  The next INR is due 10/17/2009.  Anticoagulation instructions were given to patient.  Results were reviewed/authorized by Eda Keys, PharmD.  He was notified by Eda Keys.         Prior Anticoagulation Instructions: INR 1.8  Take 1 and 1/2 tablets today.  Then return to normal dosing schedule of 1 tablet (2 mg) every day. Return to clinic in 3 weeks.   Current Anticoagulation Instructions: INR 2.7   Continue taking 2 mg (1 tablet) every day.  Return to clinic in 4 weeks.

## 2010-08-19 NOTE — Medication Information (Signed)
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 no    Recent/future dental: no  Any missed doses?: no       Is patient compliant with meds? yes       Allergies: No Known Drug Allergies  Anticoagulation Management History:      The patient is taking warfarin and comes in today for a routine follow up visit.  Positive risk factors for bleeding include an age of 75 years or older, history of CVA/TIA, and presence of serious comorbidities.  The bleeding index is 'high risk'.  Positive CHADS2 values include History of CHF, History of HTN, Age > 75 years old, and Prior Stroke/CVA/TIA.  The start date was 06/16/2005.  His last INR was 1.9.  Anticoagulation responsible provider: Tenny Craw MD, Gunnar Fusi.  INR POC: 2.3.  Cuvette Lot#: [id number].  Exp: 02/2011.    Anticoagulation Management Assessment/Plan:      The patient's current anticoagulation dose is Warfarin sodium 2 mg tabs: Take by mouth as directed.  The target INR is 2.0-3.0.  The next INR is due 02/17/2010.  Anticoagulation instructions were given to patient.  Results were reviewed/authorized by Weston Brass, PharmD.  He was notified by Dillard Cannon.         Prior Anticoagulation Instructions: INR = 1.9  The patient's dosage of coumadin will be increased.  The new dosage includes: Take 1 tablet all days except mondays and fridays take 1 and 1/2 tablets  Current Anticoagulation Instructions: INR 2.3  Continue same regimen of 1.5 tabs on Monday and Friday and 1 tab on all other days.  Re-check INR in 3  weeks.

## 2010-08-19 NOTE — Assessment & Plan Note (Signed)
Summary: per check out/sf   Visit Type:  Follow-up Primary Provider:  dr Zola Button  CC:  edema LE.  History of Present Illness: The patient is 77 years old and returns for management of CAD and atrial fibrillation. He had a remote DMI 2PCI has had multiple PCI's since then. He also has paroxysmal atrial fibrillation which is treated with amiodarone and Coumadin. He had a previous stroke with left-sided weakness a number of years ago. He also has a history of diastolic CHF and his last ejection fraction was 50%.  He says he's been doing fairly well the last year with no chest pain or palpitations. He does have some shortness of breath with exertion but this hasn't changed.  His other problems include hypertension, hyperlipidemia, chronic renal insufficiency with creatinines in the range of 1.9, and venous insufficiency.  He complains of A. abnormal sensation on the soles of his feet which has come and gone.  Current Medications (verified): 1)  Altace 10 Mg Caps (Ramipril) .... Take 1 Capsule By Mouth Once A Day 2)  Metoprolol Succinate 50 Mg Xr24h-Tab (Metoprolol Succinate) .... Take 1 Tablet By Mouth Twice A Day 3)  Warfarin Sodium 2 Mg Tabs (Warfarin Sodium) .... Take By Mouth As Directed 4)  Amiodarone Hcl 200 Mg Tabs (Amiodarone Hcl) .... Take One Tablet By Mouth Daily 5)  Furosemide 40 Mg Tabs (Furosemide) .Marland Kitchen.. 1 Tab Once Daily 6)  Mag-Ox 400 400 Mg Tabs (Magnesium Oxide) .Marland Kitchen.. 1tab Two Times A Day 7)  Simvastatin 20 Mg Tabs (Simvastatin) .... Take One Tablet By Mouth Daily At Bedtime 8)  Aspirin 81 Mg Tbec (Aspirin) .... Take One Tablet By Mouth Daily 9)  Hydralazine Hcl 50 Mg Tabs (Hydralazine Hcl) .Marland Kitchen.. 1 Tablet 3 Times Per Day  Allergies (verified): No Known Drug Allergies  Past History:  Past Medical History: Reviewed history from 01/24/2010 and no changes required. Current Problems:  COLONIC POLYPS, ADENOMATOUS (ICD-211.3) DIVERTICULOSIS, COLON (ICD-562.10) MI  (ICD-410.90) CELLULITIS, ARM (ICD-682.3) BENIGN PROSTATIC HYPERTROPHY, HX OF, S/P TURP (ICD-V13.09) VENOUS INSUFFICIENCY, LEGS (ICD-459.81) HYPERLIPIDEMIA (ICD-272.4) HYPERTENSION (ICD-401.9) CEREBROVASCULAR ACCIDENT, HX OF (ICD-V12.50) ATRIAL FIBRILLATION, HX OF (ICD-V12.59) CHF (ICD-428.0) CARDIOMYOPATHY, ISCHEMIC (ICD-414.8) CAD (ICD-414.00) IMPRESSION: 1. Lower extremity edema, probably mostly related to venous     insufficiency, although there is probably an element of diastolic     heart failure as well and this probably was previously aggravated     by Norvasc. 2. Coronary heart disease, status post remote diaphragmatic wall     infarction and multiple percutaneous coronary interventions. 3. Ejection fraction of 50%. 4. Paroxysmal atrial fibrillation, controlled on amiodarone and     Coumadin. 5. History of embolic stroke secondary to atrial fibrillation. 6. Hypertension. 7. Hyperlipidemia. 8. Chronic renal insufficiency with creatinine 1.9.  CAD  Review of Systems       ROS is negative except as outlined in HPI.   Vital Signs:  Patient profile:   77 year old male Height:      67 inches Weight:      183 pounds BMI:     28.77 Pulse rate:   58 / minute Resp:     18 per minute BP sitting:   144 / 84  (left arm)  Vitals Entered By: Marrion Coy, CNA (January 27, 2010 3:15 PM)  Physical Exam  Additional Exam:  Gen. Well-nourished, in no distress   Neck: No JVD, thyroid not enlarged, no carotid bruits Lungs: No tachypnea, clear without rales, rhonchi or wheezes Cardiovascular: Rhythm  regular, PMI not displaced,  heart sounds  normal, no murmurs or gallops, no peripheral edema, pulses normal in all 4 extremities. Abdomen: BS normal, abdomen soft and non-tender without masses or organomegaly, no hepatosplenomegaly. MS: No deformities, no cyanosis or clubbing   Neuro:  No focal sns   Skin:  no lesions    Impression & Recommendations:  Problem # 1:  CAD, NATIVE  VESSEL (ICD-414.01) He has had a previous inferior MI and multiple PCI procedures but none recently. He had no chest pain this prompted stable.  We will arrange followup with Dr. Clifton James in one year. His updated medication list for this problem includes:    Altace 10 Mg Caps (Ramipril) .Marland Kitchen... Take 1 capsule by mouth once a day    Metoprolol Succinate 50 Mg Xr24h-tab (Metoprolol succinate) .Marland Kitchen... Take 1 tablet by mouth twice a day    Warfarin Sodium 2 Mg Tabs (Warfarin sodium) .Marland Kitchen... Take by mouth as directed    Aspirin 81 Mg Tbec (Aspirin) .Marland Kitchen... Take one tablet by mouth daily  Problem # 2:  ATRIAL FIBRILLATION, CHRONIC (ICD-427.31)  He has paroxysmal atrial fibrillation which is managed with amiodarone and Coumadin. He has had no recent symptomatic recurrences. We will continue his current therapy. His updated medication list for this problem includes:    Metoprolol Succinate 50 Mg Xr24h-tab (Metoprolol succinate) .Marland Kitchen... Take 1 tablet by mouth twice a day    Warfarin Sodium 2 Mg Tabs (Warfarin sodium) .Marland Kitchen... Take by mouth as directed    Amiodarone Hcl 200 Mg Tabs (Amiodarone hcl) .Marland Kitchen... Take one tablet by mouth daily    Aspirin 81 Mg Tbec (Aspirin) .Marland Kitchen... Take one tablet by mouth daily  His updated medication list for this problem includes:    Metoprolol Succinate 50 Mg Xr24h-tab (Metoprolol succinate) .Marland Kitchen... Take 1 tablet by mouth twice a day    Warfarin Sodium 2 Mg Tabs (Warfarin sodium) .Marland Kitchen... Take by mouth as directed    Amiodarone Hcl 200 Mg Tabs (Amiodarone hcl) .Marland Kitchen... Take one tablet by mouth daily    Aspirin 81 Mg Tbec (Aspirin) .Marland Kitchen... Take one tablet by mouth daily  Problem # 3:  BEN HTN HEART DISEASE WITHOUT HEART FAIL (ICD-402.10)  He has a history of diastolic heart failure. He appears euvolemic today. We will continue current therapy. His updated medication list for this problem includes:    Altace 10 Mg Caps (Ramipril) .Marland Kitchen... Take 1 capsule by mouth once a day    Metoprolol Succinate  50 Mg Xr24h-tab (Metoprolol succinate) .Marland Kitchen... Take 1 tablet by mouth twice a day    Furosemide 40 Mg Tabs (Furosemide) .Marland Kitchen... 1 tab once daily    Aspirin 81 Mg Tbec (Aspirin) .Marland Kitchen... Take one tablet by mouth daily    Hydralazine Hcl 50 Mg Tabs (Hydralazine hcl) .Marland Kitchen... 1 tablet 3 times per day  His updated medication list for this problem includes:    Altace 10 Mg Caps (Ramipril) .Marland Kitchen... Take 1 capsule by mouth once a day    Metoprolol Succinate 50 Mg Xr24h-tab (Metoprolol succinate) .Marland Kitchen... Take 1 tablet by mouth twice a day    Furosemide 40 Mg Tabs (Furosemide) .Marland Kitchen... 1 tab once daily    Aspirin 81 Mg Tbec (Aspirin) .Marland Kitchen... Take one tablet by mouth daily    Hydralazine Hcl 50 Mg Tabs (Hydralazine hcl) .Marland Kitchen... 1 tablet 3 times per day  Problem # 4:  HYPERLIPIDEMIA (ICD-272.4) He has hyperlipidemia. Recommendations are that simvastatin should not be used in excess of 10 mg with  amiodarone. We will switch him to pravastatin 40 mg daily. He can finish his current supply of simvastatin by taking one half tablet daily until he runs out. We will get a lipid and liver profile as well as other laboratory studies 4 weeks after he starts on pravastatin.   His updated medication list for this problem includes:    Pravastatin Sodium 40 Mg Tabs (Pravastatin sodium) .Marland Kitchen... Take 1 tablet daily.  Patient Instructions: 1)  Your physician recommends that you return for lab work in4 weeks for lipid, liver, tsh, bmp, cbc  414.01, 427.31, 272.2 2)  Your physician has recommended you make the following change in your medication: STOP  simvastatin  START Pravastatin 3)  Your physician recommends that you schedule a follow-up appointment in: 1 year with Dr. Clifton James Prescriptions: PRAVASTATIN SODIUM 40 MG TABS (PRAVASTATIN SODIUM) Take 1 tablet daily.  #90 x 3   Entered by:   Lisabeth Devoid RN   Authorized by:   Lenoria Farrier, MD, [facility]   Signed by:   Lisabeth Devoid RN on 01/27/2010   Method used:   Electronically to         [facility]* (retail)             ,          Ph: [phone]       Fax: [phone]   RxID:   [id number]

## 2010-08-19 NOTE — Progress Notes (Signed)
  Walk in Patient Form Recieved " Pt left copy of Labs" sent to [name]  June 10, 2010 8:35 AM

## 2010-08-19 NOTE — Medication Information (Signed)
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 no    Recent/future dental: no  Any missed doses?: no       Is patient compliant with meds? yes       Allergies (verified): No Known Drug Allergies  Anticoagulation Management History:      The patient is taking warfarin and comes in today for a routine follow up visit.  Positive risk factors for bleeding include an age of 75 years or older, history of CVA/TIA, and presence of serious comorbidities.  The bleeding index is 'high risk'.  Positive CHADS2 values include History of CHF, History of HTN, Age > 77 years old, and Prior Stroke/CVA/TIA.  The start date was 06/16/2005.  Anticoagulation responsible provider: Tenny Craw MD, Gunnar Fusi.  INR POC: 2.3.  Cuvette Lot#: [id number].  Exp: 11/2010.    Anticoagulation Management Assessment/Plan:      The patient's current anticoagulation dose is Warfarin sodium 2 mg tabs: Take by mouth as directed.  The target INR is 2.0-3.0.  The next INR is due 11/15/2009.  Anticoagulation instructions were given to patient.  Results were reviewed/authorized by Bethena Midget, RN, BSN.  He was notified by Bethena Midget, RN, BSN.         Prior Anticoagulation Instructions: INR 2.7   Continue taking 2 mg (1 tablet) every day.  Return to clinic in 4 weeks.   Current Anticoagulation Instructions: INR 2.3 Continue 2mg s everyday. Recheck in 4 weeks.

## 2010-08-19 NOTE — Medication Information (Signed)
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 no    Recent/future dental: no  Any missed doses?: no       Is patient compliant with meds? yes       Allergies: No Known Drug Allergies  Anticoagulation Management History:      The patient is taking warfarin and comes in today for a routine follow up visit.  Positive risk factors for bleeding include an age of 30 years or older, history of CVA/TIA, and presence of serious comorbidities.  The bleeding index is 'high risk'.  Positive CHADS2 values include History of CHF, History of HTN, Age > 31 years old, and Prior Stroke/CVA/TIA.  The start date was 06/16/2005.  Anticoagulation responsible provider: Juanda Chance MD, Smitty Cords.  INR POC: 1.6.  Cuvette Lot#: [id number].  Exp: 02/2011.    Anticoagulation Management Assessment/Plan:      The patient's current anticoagulation dose is Warfarin sodium 2 mg tabs: Take by mouth as directed.  The target INR is 2.0-3.0.  The next INR is due 01/13/2010.  Anticoagulation instructions were given to patient.  Results were reviewed/authorized by Bethena Midget, RN, BSN.  He was notified by Bethena Midget, RN, BSN.         Prior Anticoagulation Instructions: INR 1.7. Take 1.5 tablets today, then take 1 tablet daily except 1.5 tablets on Fridays.  Recheck in 2 weeks.  Current Anticoagulation Instructions: INR 1.6 Today take 2 pills then resume 1 pill everyday except 1 1/2 pills on Fridays. Recheck in 10 days.

## 2010-08-19 NOTE — Medication Information (Signed)
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 no    Recent/future dental: no  Any missed doses?: no       Is patient compliant with meds? yes       Allergies: No Known Drug Allergies  Anticoagulation Management History:      The patient is taking warfarin and comes in today for a routine follow up visit.  Positive risk factors for bleeding include an age of 49 years or older, history of CVA/TIA, and presence of serious comorbidities.  The bleeding index is 'high risk'.  Positive CHADS2 values include History of CHF, History of HTN, Age > 46 years old, and Prior Stroke/CVA/TIA.  The start date was 06/16/2005.  Anticoagulation responsible provider: Excell Seltzer MD, Casimiro Needle.  INR POC: 1.7.  Cuvette Lot#: [id number].  Exp: 02/2011.    Anticoagulation Management Assessment/Plan:      The patient's current anticoagulation dose is Warfarin sodium 2 mg tabs: Take by mouth as directed.  The target INR is 2.0-3.0.  The next INR is due 01/03/2010.  Anticoagulation instructions were given to patient.  Results were reviewed/authorized by Elaina Pattee, PharmD.  He was notified by Elaina Pattee, PharmD.         Prior Anticoagulation Instructions: INR 1.6  Take 2 tablets today.  Then return to normal dosing schedule of 1 tablet every day.  Return to clinic in 2 weeks.   Current Anticoagulation Instructions: INR 1.7. Take 1.5 tablets today, then take 1 tablet daily except 1.5 tablets on Fridays.  Recheck in 2 weeks.  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  The bleeding index is 'high risk'.  Positive CHADS2 values include History of CHF, History of HTN, Age > 72 years old, and Prior Stroke/CVA/TIA.  The start date was 06/16/2005.  Anticoagulation responsible provider: Excell Seltzer MD, Casimiro Needle.  Exp: 02/2011.    Anticoagulation Management Assessment/Plan:      The patient's current anticoagulation dose is Warfarin sodium 2 mg tabs: Take by mouth as directed.  The target INR is 2.0-3.0.  The next INR is due 01/03/2010.  Anticoagulation instructions were given to patient.  Results were reviewed/authorized by Elaina Pattee, PharmD.  He was notified by Elaina Pattee, PharmD.         Prior Anticoagulation Instructions: INR 1.7. Take 1.5 tablets today, then take 1 tablet daily except 1.5 tablets on Fridays.  Recheck in 2 weeks.

## 2010-08-19 NOTE — Medication Information (Signed)
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  Health status changes: no    Bleeding/hemorrhagic complications: no    Recent/future hospitalizations: no    Any changes in medication regimen? no    Recent/future dental: no  Any missed doses?: no       Is patient compliant with meds? yes       Allergies: No Known Drug Allergies  Anticoagulation Management History:      The patient is taking warfarin and comes in today for a routine follow up visit.  Positive risk factors for bleeding include an age of 14 years or older, history of CVA/TIA, and presence of serious comorbidities.  The bleeding index is 'high risk'.  Positive CHADS2 values include History of CHF, History of HTN, Age > 75 years old, and Prior Stroke/CVA/TIA.  The start date was 06/16/2005.  His last INR was 1.9.  Anticoagulation responsible provider: Riley Kill MD, Maisie Fus.  INR POC: 1.9.  Cuvette Lot#: [id number].  Exp: 06/2011.    Anticoagulation Management Assessment/Plan:      The patient's current anticoagulation dose is Warfarin sodium 2 mg tabs: Take by mouth as directed.  The target INR is 2.0-3.0.  The next INR is due 06/30/2010.  Anticoagulation instructions were given to patient.  Results were reviewed/authorized by Weston Brass, PharmD.  He was notified by Hoy Register, PharmD Candidate.         Prior Anticoagulation Instructions: INR 1.8  Take 1.5 tablets today then take 1 tablet everyday except 1.5 tablets on Monday, Wednesday, and Friday.  Recheck INR in 3 weeks.  Current Anticoagulation Instructions: INR 1.9 Take  2 tablets today then take 1.5 tablet everyday except 1 tablet on Sunday, Tuesday, and Thursday Recheck INR in 2 weeks

## 2010-08-19 NOTE — Medication Information (Signed)
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 no    Recent/future dental: no  Any missed doses?: no       Is patient compliant with meds? yes       Allergies: No Known Drug Allergies  Anticoagulation Management History:      The patient is taking warfarin and comes in today for a routine follow up visit.  Positive risk factors for bleeding include an age of 75 years or older, history of CVA/TIA, and presence of serious comorbidities.  The bleeding index is 'high risk'.  Positive CHADS2 values include History of CHF, History of HTN, Age > 75 years old, and Prior Stroke/CVA/TIA.  The start date was 06/16/2005.  His last INR was 1.9.  Anticoagulation responsible provider: Myrtis Ser MD, Tinnie Gens.  INR POC: 2.1.  Cuvette Lot#: [id number].  Exp: 05/2011.    Anticoagulation Management Assessment/Plan:      The patient's current anticoagulation dose is Warfarin sodium 2 mg tabs: Take by mouth as directed.  The target INR is 2.0-3.0.  The next INR is due 04/30/2010.  Anticoagulation instructions were given to patient.  Results were reviewed/authorized by Weston Brass, PharmD.  He was notified by Harrel Carina, PharmD candidate.         Prior Anticoagulation Instructions: INR 2.2  Continue taking 1 tablet (2mg ) every day except take 1.5 tablets (3mg ) on Mondays and Fridays.  Recheck in 4 weeks.   Current Anticoagulation Instructions: INR 2.1  Continue taking 1 tablet everyday except take 1 1/2 tablets on Mondays and Fridays.  Re-check INR in 4 weeks.

## 2010-08-19 NOTE — Medication Information (Signed)
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 no    Recent/future dental: no  Any missed doses?: no       Is patient compliant with meds? yes       Allergies (verified): No Known Drug Allergies  Anticoagulation Management History:      The patient is taking warfarin and comes in today for a routine follow up visit.  Positive risk factors for bleeding include an age of 75 years or older, history of CVA/TIA, and presence of serious comorbidities.  The bleeding index is 'high risk'.  Positive CHADS2 values include History of CHF, History of HTN, Age > 75 years old, and Prior Stroke/CVA/TIA.  The start date was 06/16/2005.  His last INR was 1.9.  Anticoagulation responsible provider: Daleen Squibb MD, Maisie Fus.  INR POC: 1.8.  Cuvette Lot#: [id number].  Exp: 05/2011.    Anticoagulation Management Assessment/Plan:      The patient's current anticoagulation dose is Warfarin sodium 2 mg tabs: Take by mouth as directed.  The target INR is 2.0-3.0.  The next INR is due 06/09/2010.  Anticoagulation instructions were given to patient.  Results were reviewed/authorized by Weston Brass, PharmD.  He was notified by Hoy Register, PharmD Candidate.         Prior Anticoagulation Instructions: INR 1.8   Take 2 tablets today. Then continue same dose of 1 tablet everyday except 1 1/2 tablets on Monday and Friday. Recheck in 4 weeks.    Current Anticoagulation Instructions: INR 1.8  Take 1.5 tablets today then take 1 tablet everyday except 1.5 tablets on Monday, Wednesday, and  Friday.  Recheck INR in 3 weeks.

## 2010-08-19 NOTE — Progress Notes (Signed)
Summary: pt want to know what he can take for diarrhea  Phone Note Call from Patient Call back at Home Phone [phone]   Caller: Patient Summary of Call: pt have questions about taking something for diarrhea Initial call taken by: Judie Grieve,  July 29, 2009 1:00 PM  Follow-up for Phone Call        I called the pt and advised him he could take Immodium. The pt verbalizes understanding.  Follow-up by: Sherri Rad, RN, BSN,  July 29, 2009 1:29 PM

## 2010-08-19 NOTE — Progress Notes (Signed)
Summary: Lab report  Phone Note Outgoing Call   Call placed by: Sherri Rad, RN, BSN,  June 10, 2010 9:09 AM Summary of Call: I called the pt to let him know I had received the lab report he left in the office yesterday. He did have repeat LFT's on 05/19/10 with Dr. Parke Simmers. I explained that I  would review these with Dr. Juanda Chance next week to see what he wants to do about his medication. The pt is stilll holding his Pravastatin and has been instructed to continue doing this. His AST is still slightly elevated at 67. I will call the pt back next week after reviewing with Dr. Juanda Chance. He voices understanding.  Initial call taken by: Sherri Rad, RN, BSN,  June 10, 2010 9:12 AM  Follow-up for Phone Call        I reviewed the pt's lab report with Dr. Juanda Chance. Orders received to have the pt restart Pravastatin @ 10mg  once daily and recheck a lipid and liver panel in 6 weeks. I have called and explained this to the pt. We will all in Pravastatin 20mg  once daily as directed. The pt has been instructed to take a half a tablet once daily. He verbalizes understanding.  Follow-up by: Sherri Rad, RN, BSN,  June 19, 2010 12:12 PM    New/Updated Medications: PRAVASTATIN SODIUM 20 MG TABS (PRAVASTATIN SODIUM) Take one tablet by mouth daily at bedtime as directed Prescriptions: PRAVASTATIN SODIUM 20 MG TABS (PRAVASTATIN SODIUM) Take one tablet by mouth daily at bedtime as directed  #30 x 3   Entered by:   Sherri Rad, RN, BSN   Authorized by:   Lenoria Farrier, MD, [facility]   Signed by:   Sherri Rad, RN, BSN on 06/19/2010   Method used:   Electronically to        [facility] (retail)       [address]       [place], [place]  [id number]       Ph: [phone]       Fax: [phone]   RxID:   [id number]

## 2010-08-19 NOTE — Medication Information (Signed)
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 yes       Current Medications (verified): 1)  Altace 10 Mg Caps (Ramipril) .... Take 1 Capsule By Mouth Once A Day 2)  Metoprolol Succinate 50 Mg Xr24h-Tab (Metoprolol Succinate) .... Take 1 Tablet By Mouth Twice A Day 3)  Warfarin Sodium 2 Mg Tabs (Warfarin Sodium) .... Take By Mouth As Directed 4)  Amiodarone Hcl 200 Mg Tabs (Amiodarone Hcl) .... Take One Tablet By Mouth Daily 5)  Furosemide 40 Mg Tabs (Furosemide) .Marland Kitchen.. 1 Tab Once Daily 6)  Mag-Ox 400 400 Mg Tabs (Magnesium Oxide) .Marland Kitchen.. 1tab Two Times A Day 7)  Simvastatin 20 Mg Tabs (Simvastatin) .... Take One Tablet By Mouth Daily At Bedtime 8)  Aspirin 81 Mg Tbec (Aspirin) .... Take One Tablet By Mouth Daily 9)  Lumigan 0.03 % Soln (Bimatoprost) .Marland Kitchen.. 1 Gtt R Eye Once Daily 10)  Azopt 1 % Susp (Brinzolamide) .Marland Kitchen.. 1 Drop Rt Eye Two Times A Day 11)  Combigan 0.2-0.5 % Soln (Brimonidine Tartrate-Timolol) .Marland Kitchen.. 1 Drop Each Two Times A Day 12)  Hydralazine Hcl 50 Mg Tabs (Hydralazine Hcl) .Marland Kitchen.. 1 Tablet 3 Times Per Day 13)  Elestat 0.05 % Soln (Epinastine Hcl) .Marland Kitchen.. 1 Gtt L Eye Two Times A Day 14)  Systane 0.4-0.3 % Soln (Polyethyl Glycol-Propyl Glycol) .Marland Kitchen.. 1 Gtt Both Eyes 2-3 Times Daily 15)  Potassium Chloride Crys Cr 20 Meq Cr-Tabs (Potassium Chloride Crys Cr) .... Take One Tablet By Mouth Daily  Allergies (verified): No Known Drug Allergies  Anticoagulation Management History:      The patient is  taking warfarin and comes in today for a routine follow up visit.  Positive risk factors for bleeding include an age of 75 years or older, history of CVA/TIA, and presence of serious comorbidities.  The bleeding index is 'high risk'.  Positive CHADS2 values include History of CHF, History of HTN, Age > 32 years old, and Prior Stroke/CVA/TIA.  The start date was 06/16/2005.  Anticoagulation responsible provider: Clifton James MD, Cristal Deer.  INR POC: 2.1.  Cuvette Lot#: [id number].  Exp: 08/2010.    Anticoagulation Management Assessment/Plan:      The patient's current anticoagulation dose is Warfarin sodium 2 mg tabs: Take by mouth as directed.  The target INR is 2.0-3.0.  The next INR is due 08/22/2009.  Anticoagulation instructions were given to patient.  Results were reviewed/authorized by Ysidro Evert, Pham D Candidate.  He was notified by Ysidro Evert, Pham D Candidate.         Prior Anticoagulation Instructions: INR 2.3  Continue on same dosage 1 tablet daily.  Recheck in 4 weeks.    Current Anticoagulation Instructions: INR 2.1 Continue same dosage 2mg  daily  Recheck in 4 weeks

## 2010-08-19 NOTE — Medication Information (Signed)
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 no    Recent/future dental: no  Any missed doses?: no       Is patient compliant with meds? yes       Current Medications (verified): 1)  Altace 10 Mg Caps (Ramipril) .... Take 1 Capsule By Mouth Once A Day 2)  Metoprolol Succinate 50 Mg Xr24h-Tab (Metoprolol Succinate) .... Take 1 Tablet By Mouth Twice A Day 3)  Warfarin Sodium 2 Mg Tabs (Warfarin Sodium) .... Take By Mouth As Directed 4)  Amiodarone Hcl 200 Mg Tabs (Amiodarone Hcl) .... Take One Tablet By Mouth Daily 5)  Furosemide 40 Mg Tabs (Furosemide) .Marland Kitchen.. 1 Tab Once Daily 6)  Mag-Ox 400 400 Mg Tabs (Magnesium Oxide) .Marland Kitchen.. 1tab Two Times A Day 7)  Simvastatin 20 Mg Tabs (Simvastatin) .... Take One Tablet By Mouth Daily At Bedtime 8)  Aspirin 81 Mg Tbec (Aspirin) .... Take One Tablet By Mouth Daily 9)  Lumigan 0.03 % Soln (Bimatoprost) .Marland Kitchen.. 1 Gtt R Eye Once Daily 10)  Azopt 1 % Susp (Brinzolamide) .Marland Kitchen.. 1 Drop Rt Eye Two Times A Day 11)  Combigan 0.2-0.5 % Soln (Brimonidine Tartrate-Timolol) .Marland Kitchen.. 1 Drop Each Two Times A Day 12)  Hydralazine Hcl 50 Mg Tabs (Hydralazine Hcl) .Marland Kitchen.. 1 Tablet 3 Times Per Day 13)  Elestat 0.05 % Soln (Epinastine Hcl) .Marland Kitchen.. 1 Gtt L Eye Two Times A Day 14)  Systane 0.4-0.3 % Soln (Polyethyl Glycol-Propyl Glycol) .Marland Kitchen.. 1 Gtt Both Eyes 2-3 Times Daily 15)  Potassium Chloride Crys Cr 20 Meq Cr-Tabs (Potassium Chloride Crys Cr) .... Take One Tablet By Mouth Daily  Allergies (verified): No  Known Drug Allergies  Anticoagulation Management History:      The patient is taking warfarin and comes in today for a routine follow up visit.  Positive risk factors for bleeding include an age of 75 years or older, history of CVA/TIA, and presence of serious comorbidities.  The bleeding index is 'high risk'.  Positive CHADS2 values include History of CHF, History of HTN, Age > 46 years old, and Prior Stroke/CVA/TIA.  The start date was 06/16/2005.  Anticoagulation responsible provider: Juanda Chance MD, Smitty Cords.  INR POC: 1.8.  Cuvette Lot#: [id number].  Exp: 10/2010.    Anticoagulation Management Assessment/Plan:      The patient's current anticoagulation dose is Warfarin sodium 2 mg tabs: Take by mouth as directed.  The target INR is 2.0-3.0.  The next INR is due 09/19/2009.  Anticoagulation instructions were given to patient.  Results were reviewed/authorized by Eda Keys, PharmD.  He was notified by Eda Keys.         Prior Anticoagulation Instructions: INR 2.1 Continue same dosage 2mg  daily  Recheck in 4 weeks  Current Anticoagulation Instructions: INR 1.8  Take 1 and 1/2 tablets today.  Then return to normal dosing schedule of 1 tablet (2 mg) every day. Return to clinic in 3 weeks.

## 2010-08-19 NOTE — Medication Information (Signed)
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 Juanda Chance and Dr. Lottie Dawson, pt will stay on Coumadin   Bleeding/hemorrhagic complications: no    Recent/future hospitalizations: no    Any changes in medication regimen? no    Recent/future dental: no  Any missed doses?: no       Is patient compliant with meds? yes       Allergies: No Known Drug Allergies  Anticoagulation Management History:      The patient is taking warfarin and comes in today for a routine follow up visit.  Positive risk factors for bleeding include an age of 32 years or older, history of CVA/TIA, and presence of serious comorbidities.  The bleeding index is 'high risk'.  Positive CHADS2 values include History of CHF, History of HTN, Age > 70 years old, and Prior Stroke/CVA/TIA.  The start date was 06/16/2005.  Anticoagulation responsible provider: Juanda Chance MD, Smitty Cords.  INR POC: 1.6.  Cuvette Lot#: [id number].  Exp: 12/2010.    Anticoagulation Management Assessment/Plan:      The patient's current anticoagulation dose is Warfarin sodium 2 mg tabs: Take by mouth as directed.  The target INR is 2.0-3.0.  The next INR is due 12/06/2009.  Anticoagulation instructions were given to patient.  Results were reviewed/authorized by Weston Brass, PharmD.  He was notified by Weston Brass PharmD.         Prior Anticoagulation Instructions: INR 2.3 Continue 2mg s everyday. Recheck in 4 weeks.   Current Anticoagulation Instructions: INR 1.6  Take 1 1/2 tablets today and tomorrow then resume same dose of 1 tablet daily

## 2010-08-19 NOTE — Progress Notes (Signed)
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 BB  ---- 11/14/2009 8:57 AM, Weston Brass PharmD wrote: Pt having opthalmic procedure done by Dr. Lottie Dawson in [place] next week on 5/3.  He requested the pt hold Coumadin for 3-4 days.  If pt cannot stop Coumadin without being bridged, he would prefer he just stay on the Coumadin.  Please advise ASAP. ------------------------------  Phone Note Outgoing Call   Call placed by: Weston Brass PharmD,  November 14, 2009 3:38 PM Summary of Call: Called Dr. Georgette Dover office in [place].  LMOM for Joycie Peek, nurse for Dr. Lottie Dawson.  Informed her Dr. Juanda Chance recommended he stay on Coumadin or be bridged with Lovenox.  When I spoke with Dr. Lottie Dawson this morning, he preferred Coumadin over Lovenox bridging.  Asked her to call with any questions.   Phone Number- [phone] Dr. Lottie Dawson pager- [phone] Initial call taken by: Weston Brass PharmD,  November 14, 2009 3:50 PM

## 2010-08-19 NOTE — Letter (Signed)
Summary: [facility]  [facility]   Imported By: Roderic Ovens 08/13/2009 11:54:01  _____________________________________________________________________  External Attachment:    Type:   Image     Comment:   External Document

## 2010-08-19 NOTE — Medication Information (Signed)
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 yes       Details: New eye drop started after ocular surgery, predforte  Recent/future dental: no  Any missed doses?: no       Is patient compliant with meds? yes       Allergies: No Known Drug Allergies  Anticoagulation Management History:      The patient is taking warfarin and comes in today for a routine follow up visit.  Positive risk factors for bleeding include an age of 35 years or older, history of CVA/TIA, and presence of serious comorbidities.  The bleeding index is 'high risk'.  Positive CHADS2 values include History of CHF, History of HTN, Age > 80 years old, and Prior Stroke/CVA/TIA.  The start date was 06/16/2005.  Anticoagulation responsible provider: Tenny Craw MD, Gunnar Fusi.  INR POC: 1.6.  Cuvette Lot#: [id number].  Exp: 02/2011.    Anticoagulation Management Assessment/Plan:      The patient's current anticoagulation dose is Warfarin sodium 2 mg tabs: Take by mouth as directed.  The target INR is 2.0-3.0.  The next INR is due 12/20/2009.  Anticoagulation instructions were given to patient.  Results were reviewed/authorized by Eda Keys, PharmD.  He was notified by Eda Keys.         Prior Anticoagulation Instructions: INR 1.6  Take 1 1/2 tablets today and tomorrow then resume same dose of 1 tablet daily   Current Anticoagulation Instructions: INR 1.6  Take 2 tablets today.  Then return to normal dosing  schedule of 1 tablet every day.  Return to clinic in 2 weeks.

## 2010-08-19 NOTE — Medication Information (Signed)
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 no    Recent/future dental: no  Any missed doses?: no       Is patient compliant with meds? yes       Allergies: No Known Drug Allergies  Anticoagulation Management History:      The patient is taking warfarin and comes in today for a routine follow up visit.  Positive risk factors for bleeding include an age of 75 years or older, history of CVA/TIA, and presence of serious comorbidities.  The bleeding index is 'high risk'.  Positive CHADS2 values include History of CHF, History of HTN, Age > 33 years old, and Prior Stroke/CVA/TIA.  The start date was 06/16/2005.  His last INR was 1.9.  Anticoagulation responsible provider: Daymian Lill MD, Fayrene Fearing.  INR POC: 1.8.  Cuvette Lot#: [id number].  Exp: 05/2011.    Anticoagulation Management Assessment/Plan:      The patient's current anticoagulation dose is Warfarin sodium 2 mg tabs: Take by mouth as directed.  The target INR is 2.0-3.0.  The next INR is due 05/22/2010.  Anticoagulation instructions were given to patient.  Results were reviewed/authorized by Weston Brass, PharmD.  He was notified by Ilean Skill D candidate.         Prior Anticoagulation Instructions: INR 2.1  Continue taking 1 tablet everyday except take 1 1/2 tablets on Mondays and Fridays. Re-check INR in 4 weeks.   Current Anticoagulation Instructions: INR 1.8   Take 2 tablets today. Then continue same dose of 1 tablet everyday except 1 1/2 tablets on Monday and Friday. Recheck in 4 weeks.

## 2010-08-19 NOTE — Medication Information (Signed)
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 no    Recent/future dental: no  Any missed doses?: no       Is patient compliant with meds? yes       Allergies: No Known Drug Allergies  Anticoagulation Management History:      The patient is taking warfarin and comes in today for a routine follow up visit.  Positive risk factors for bleeding include an age of 75 years or older, history of CVA/TIA, and presence of serious comorbidities.  The bleeding index is 'high risk'.  Positive CHADS2 values include History of CHF, History of HTN, Age > 52 years old, and Prior Stroke/CVA/TIA.  The start date was 06/16/2005.  His last INR was 1.9.  Anticoagulation responsible provider: Eden Emms MD, Theron Arista.  INR POC: 1.8.  Cuvette Lot#: [id number].  Exp: 04/2011.    Anticoagulation Management Assessment/Plan:      The patient's current anticoagulation dose is Warfarin sodium 2 mg tabs: Take by mouth as directed.  The target INR is 2.0-3.0.  The next INR is due 03/10/2010.  Anticoagulation instructions were given to patient.  Results were reviewed/authorized by Weston Brass, PharmD.  He was notified by Weston Brass PharmD.         Prior Anticoagulation Instructions: INR 2.3  Continue same regimen of 1.5 tabs on Monday and Friday and 1 tab on all other days.  Re-check INR in 3 weeks.  Current Anticoagulation Instructions: INR 1.8  Take 2 tablets today then resume same dose of 1 tablet every day except 1 1/2 tablets on Monday and Friday.   Recheck INR in 3 weeks.

## 2010-08-19 NOTE — Letter (Signed)
Summary: [facility]   Imported By: Roderic Ovens 09/03/2009 12:36:49  _____________________________________________________________________  External Attachment:    Type:   Image     Comment:   External Document

## 2010-08-19 NOTE — Medication Information (Signed)
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 no    Recent/future dental: no  Any missed doses?: no       Is patient compliant with meds? yes       Current Medications (verified): 1)  Altace 10 Mg Caps (Ramipril) .... Take 1 Capsule By Mouth Once A Day 2)  Metoprolol Succinate 50 Mg Xr24h-Tab (Metoprolol Succinate) .... Take 1 Tablet By Mouth Twice A Day 3)  Warfarin Sodium 2 Mg Tabs (Warfarin Sodium) .... Take By Mouth As Directed 4)  Amiodarone Hcl 200 Mg Tabs (Amiodarone Hcl) .... Take One Tablet By Mouth Daily 5)  Furosemide 40 Mg Tabs (Furosemide) .Marland Kitchen.. 1 Tab Once Daily 6)  Mag-Ox 400 400 Mg Tabs (Magnesium Oxide) .Marland Kitchen.. 1tab Two Times A Day 7)  Simvastatin 20 Mg Tabs (Simvastatin) .... Take One Tablet By Mouth Daily At Bedtime 8)  Aspirin 81 Mg Tbec (Aspirin) .... Take One Tablet By Mouth Daily 9)  Lumigan 0.03 % Soln (Bimatoprost) .Marland Kitchen.. 1 Gtt R Eye Once Daily 10)  Azopt 1 % Susp (Brinzolamide) .Marland Kitchen.. 1 Drop Rt Eye Two Times A Day 11)  Combigan 0.2-0.5 % Soln (Brimonidine Tartrate-Timolol) .Marland Kitchen.. 1 Drop Each Two Times A Day 12)  Hydralazine Hcl 50 Mg Tabs (Hydralazine Hcl) .Marland Kitchen.. 1 Tablet 3 Times Per Day 13)  Elestat 0.05 % Soln (Epinastine Hcl) .Marland Kitchen.. 1 Gtt L Eye Two Times A Day 14)  Systane 0.4-0.3 % Soln (Polyethyl Glycol-Propyl Glycol) .Marland Kitchen.. 1 Gtt Both Eyes 2-3 Times Daily 15)  Potassium Chloride Crys Cr 20 Meq Cr-Tabs (Potassium Chloride Crys Cr) .... Take One Tablet By Mouth Daily  Allergies (verified): No Known Drug  Allergies  Anticoagulation Management History:      The patient is taking warfarin and comes in today for a routine follow up visit.  Positive risk factors for bleeding include an age of 75 years or older, history of CVA/TIA, and presence of serious comorbidities.  The bleeding index is 'high risk'.  Positive CHADS2 values include History of CHF, History of HTN, Age > 75 years old, and Prior Stroke/CVA/TIA.  The start date was 06/16/2005.  Today's INR is 1.9.  Anticoagulation responsible provider: Jens Som MD, Arlys John.  INR POC: 1.9.  Exp: 02/2011.    Anticoagulation Management Assessment/Plan:      The patient's current anticoagulation dose is Warfarin sodium 2 mg tabs: Take by mouth as directed.  The target INR is 2.0-3.0.  The next INR is due 01/27/2010.  Anticoagulation instructions were given to patient.  Results were reviewed/authorized by Loma Newton, PharmD.  He was notified by Loma Newton.         Prior Anticoagulation Instructions: INR 1.6 Today take 2 pills then resume 1 pill everyday except 1 1/2 pills on Fridays. Recheck in 10 days.   Current Anticoagulation Instructions: INR = 1.9  The patient's dosage of coumadin will be increased.  The new dosage includes: Take 1 tablet all days except mondays and fridays take 1 and 1/2 tablets

## 2010-08-21 NOTE — Medication Information (Signed)
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 yes       Details: Started a new OTC vitamin.  Unsure as to what it is.  Recent/future dental: no  Any missed doses?: no       Is patient compliant with meds? yes       Allergies: No Known Drug Allergies  Anticoagulation Management History:      The patient is taking warfarin and comes in today for a routine follow up visit.  Positive risk factors for bleeding include an age of 75 years or older, history of CVA/TIA, and presence of serious comorbidities.  The bleeding index is 'high risk'.  Positive CHADS2 values include History of CHF, History of HTN, Age > 16 years old, and Prior Stroke/CVA/TIA.  The start date was 06/16/2005.  His last INR was 1.9 and today's INR is 1.9.  Anticoagulation responsible provider: Tenny Craw MD,Migdalia Olejniczak.  INR POC: 1.9.  Cuvette Lot#: [id number].  Exp: 06/2011.    Anticoagulation Management Assessment/Plan:      The patient's current anticoagulation dose is Warfarin sodium 2 mg tabs: Take by mouth as directed.  The target INR is 2.0-3.0.  The next INR is due 08/22/2010.  Anticoagulation instructions were given to patient.  Results were reviewed/authorized by Louann Sjogren, PharmD.         Prior Anticoagulation Instructions: cont with current regimen return to clinic on jan 6th at 0315 pm  Current Anticoagulation Instructions: INR 1.9 (goal 2-3)  Continue on current dosing schedule below.

## 2010-08-21 NOTE — Medication Information (Signed)
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 no    Recent/future dental: no  Any missed doses?: no       Is patient compliant with meds? yes       Current Medications (verified): 1)  Altace 10 Mg Caps (Ramipril) .... Take 1 Capsule By Mouth Once A Day 2)  Metoprolol Succinate 50 Mg Xr24h-Tab (Metoprolol Succinate) .... Take 1 Tablet By Mouth Twice A Day 3)  Warfarin Sodium 2 Mg Tabs (Warfarin Sodium) .... Take By Mouth As Directed 4)  Amiodarone Hcl 200 Mg Tabs (Amiodarone Hcl) .... Take 1/2  Tablet By Mouth Daily 5)  Furosemide 40 Mg Tabs (Furosemide) .Marland Kitchen.. 1 Tab Once Daily 6)  Mag-Ox 400 400 Mg Tabs (Magnesium Oxide) .Marland Kitchen.. 1tab Two Times A Day 7)  Pravastatin Sodium 20 Mg Tabs (Pravastatin Sodium) .... Take One Tablet By Mouth Daily At Bedtime As Directed 8)  Aspirin 81 Mg Tbec (Aspirin) .... Take One Tablet By Mouth Daily 9)  Hydralazine Hcl 50 Mg Tabs (Hydralazine Hcl) .Marland Kitchen.. 1 Tablet 3 Times Per Day  Allergies (verified): No Known Drug Allergies  Anticoagulation Management History:      Positive risk factors for bleeding include an age of 51 years or older, history of CVA/TIA, and presence of serious comorbidities.  The bleeding index is 'high risk'.  Positive CHADS2 values include History of CHF, History of HTN, Age > 54 years old, and Prior Stroke/CVA/TIA.  The start date was 06/16/2005.  His last INR was 1.9.  Anticoagulation responsible Delbert Vu: Riley Kill MD, Maisie Fus.  INR POC: 2.  Exp: 06/2011.     Anticoagulation Management Assessment/Plan:      The patient's current anticoagulation dose is Warfarin sodium 2 mg tabs: Take by mouth as directed.  The target INR is 2.0-3.0.  The next INR is due 07/21/2010.  Anticoagulation instructions were given to patient.  Results were reviewed/authorized by Weston Brass, PharmD.         Prior Anticoagulation Instructions: INR 1.9 Take 2 tablets today then take 1.5 tablet everyday except 1 tablet on Sunday, Tuesday, and Thursday Recheck INR in 2 weeks  Current Anticoagulation Instructions: cont with current regimen return to clinic on jan 6th at 0315 pm

## 2010-08-21 NOTE — Letter (Signed)
Summary: Generic Letter  Architectural technologist, Main Office  [address]   [place], [place] [address]   Phone: [phone]  Fax: [phone]        August 04, 2010 MRN: [id number]    [name] [address] [place], [place]  [address]    Dear Mr. Schindel,  This is just to let you know that you are due for repeat FASTING labs to recheck your cholesterol and liver tests since Dr. Juanda Chance restarted your pravastatin around the beginning of December. Please call our office so we may schedule this for you as soon as possible. The phone number is [phone].         Sincerely,  Sherri Rad, RN, BSN  This letter has been electronically signed by your physician.

## 2010-08-21 NOTE — Progress Notes (Signed)
  Pt signed ROI,Labs given  to pt [facility]  August 12, 2010 3:22 PM'

## 2010-08-22 ENCOUNTER — Encounter (INDEPENDENT_AMBULATORY_CARE_PROVIDER_SITE_OTHER): Payer: Medicare Other

## 2010-08-22 ENCOUNTER — Ambulatory Visit: Admit: 2010-08-22 | Payer: Self-pay

## 2010-08-22 ENCOUNTER — Encounter: Payer: Self-pay | Admitting: Cardiology

## 2010-08-22 DIAGNOSIS — I4891 Unspecified atrial fibrillation: Secondary | ICD-10-CM

## 2010-08-22 DIAGNOSIS — Z7901 Long term (current) use of anticoagulants: Secondary | ICD-10-CM

## 2010-08-22 LAB — CONVERTED CEMR LAB: POC INR: 2.4

## 2010-08-27 NOTE — Medication Information (Signed)
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 yes       Details: topiramate 50 mg   Recent/future dental: no  Any missed doses?: no       Is patient compliant with meds? yes       Allergies: No Known Drug Allergies  Anticoagulation Management History:      The patient is taking warfarin and comes in today for a routine follow up visit.  Positive risk factors for bleeding include an age of 75 years or older, history of CVA/TIA, and presence of serious comorbidities.  The bleeding index is 'high risk'.  Positive CHADS2 values include History of CHF, History of HTN, Age > 33 years old, and Prior Stroke/CVA/TIA.  The start date was 06/16/2005.  His last INR was 1.9.  Anticoagulation responsible provider: Jens Som MD, Arlys John.  INR POC: 2.4.  Cuvette Lot#: [id number].  Exp: 07/2011.    Anticoagulation Management Assessment/Plan:      The patient's current anticoagulation dose is Warfarin sodium 2 mg tabs: Take by mouth as directed.  The target INR is 2.0-3.0.  The next INR is due 09/19/2010.  Anticoagulation instructions were given to patient.  Results were reviewed/authorized by Weston Brass, PharmD.  He was notified by Margot Chimes PharmD Candidate.         Prior Anticoagulation Instructions: INR 1.9 (goal 2-3)  Continue on current dosing schedule below.  Current Anticoagulation Instructions: INR 2.4   Continue current Coumadin dose of 1 1/2 tablet everyday except 1 tablet on Sundays, Tuesdays, and Thursdays.

## 2010-09-02 DIAGNOSIS — I6350 Cerebral infarction due to unspecified occlusion or stenosis of unspecified cerebral artery: Secondary | ICD-10-CM

## 2010-09-02 DIAGNOSIS — I4891 Unspecified atrial fibrillation: Secondary | ICD-10-CM

## 2010-09-02 DIAGNOSIS — Z7901 Long term (current) use of anticoagulants: Secondary | ICD-10-CM | POA: Insufficient documentation

## 2010-09-08 ENCOUNTER — Other Ambulatory Visit: Payer: Self-pay | Admitting: Cardiovascular Disease

## 2010-09-08 ENCOUNTER — Other Ambulatory Visit (INDEPENDENT_AMBULATORY_CARE_PROVIDER_SITE_OTHER): Payer: Medicare Other

## 2010-09-08 ENCOUNTER — Encounter: Payer: Self-pay | Admitting: Cardiovascular Disease

## 2010-09-08 DIAGNOSIS — E782 Mixed hyperlipidemia: Secondary | ICD-10-CM

## 2010-09-08 LAB — HEPATIC FUNCTION PANEL
Bilirubin, Direct: 0.1 mg/dL (ref 0.0–0.3)
Total Bilirubin: 0.7 mg/dL (ref 0.3–1.2)

## 2010-09-19 ENCOUNTER — Encounter (INDEPENDENT_AMBULATORY_CARE_PROVIDER_SITE_OTHER): Payer: Medicare Other

## 2010-09-19 ENCOUNTER — Encounter: Payer: Self-pay | Admitting: Cardiovascular Disease

## 2010-09-19 DIAGNOSIS — I4891 Unspecified atrial fibrillation: Secondary | ICD-10-CM

## 2010-09-19 DIAGNOSIS — Z7901 Long term (current) use of anticoagulants: Secondary | ICD-10-CM

## 2010-09-19 LAB — CONVERTED CEMR LAB: POC INR: 1.9

## 2010-09-25 NOTE — Medication Information (Signed)
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 no    Recent/future dental: no  Any missed doses?: no       Is patient compliant with meds? yes       Allergies: No Known Drug Allergies  Anticoagulation Management History:      The patient is taking warfarin and comes in today for a routine follow up visit.  Positive risk factors for bleeding include an age of 75 years or older, history of CVA/TIA, and presence of serious comorbidities.  The bleeding index is 'high risk'.  Positive CHADS2 values include History of CHF, History of HTN, Age > 75 years old, and Prior Stroke/CVA/TIA.  The start date was 06/16/2005.  His last INR was 1.9.  Anticoagulation responsible provider: Clifton James MD, Cristal Deer.  INR POC: 1.9.  Cuvette Lot#: [id number].  Exp: 07/2011.    Anticoagulation Management Assessment/Plan:      The patient's current anticoagulation dose is Warfarin sodium 2 mg tabs: Take by mouth as directed.  The target INR is 2.0-3.0.  The next INR is due 10/10/2010.  Anticoagulation instructions were given to patient.  Results were reviewed/authorized by Bethena Midget, RN, BSN.  He was notified by Bethena Midget, RN, BSN.         Prior Anticoagulation Instructions: INR 2.4   Continue current Coumadin dose of 1 1/2 tablet everyday except 1 tablet on Sundays, Tuesdays, and Thursdays.    Current Anticoagulation Instructions: INR 1.9 Today take 2pills then resume 1.5 pills everyday except 1 pill on Sundays, Tuesdays and Thursdays. Recheck in 3  weeks.

## 2010-10-10 ENCOUNTER — Ambulatory Visit (INDEPENDENT_AMBULATORY_CARE_PROVIDER_SITE_OTHER): Payer: Medicare Other | Admitting: *Deleted

## 2010-10-10 DIAGNOSIS — Z7901 Long term (current) use of anticoagulants: Secondary | ICD-10-CM

## 2010-10-10 DIAGNOSIS — I4891 Unspecified atrial fibrillation: Secondary | ICD-10-CM

## 2010-10-10 DIAGNOSIS — I6350 Cerebral infarction due to unspecified occlusion or stenosis of unspecified cerebral artery: Secondary | ICD-10-CM

## 2010-10-10 LAB — POCT INR: INR: 2

## 2010-10-10 NOTE — Patient Instructions (Signed)
INR 2 Cont with current regimen of 2.5 tabs on Mon, Wed, Fri, Sat and 2 tabs the rest of the week Recheck INR in 4 weeks on 4/20 @ 315

## 2010-10-15 ENCOUNTER — Other Ambulatory Visit: Payer: Self-pay | Admitting: Pharmacist

## 2010-10-15 MED ORDER — WARFARIN SODIUM 2 MG PO TABS: ORAL_TABLET | ORAL | Status: DC

## 2010-11-07 ENCOUNTER — Encounter: Payer: Medicare Other | Admitting: *Deleted

## 2010-11-11 ENCOUNTER — Ambulatory Visit (INDEPENDENT_AMBULATORY_CARE_PROVIDER_SITE_OTHER): Payer: Medicare Other | Admitting: *Deleted

## 2010-11-11 DIAGNOSIS — I6350 Cerebral infarction due to unspecified occlusion or stenosis of unspecified cerebral artery: Secondary | ICD-10-CM

## 2010-11-11 DIAGNOSIS — Z7901 Long term (current) use of anticoagulants: Secondary | ICD-10-CM

## 2010-11-11 DIAGNOSIS — I4891 Unspecified atrial fibrillation: Secondary | ICD-10-CM

## 2010-12-02 NOTE — Assessment & Plan Note (Signed)
[facility]                         GASTROENTEROLOGY OFFICE NOTE   NAME:[name]                       MRN:          [id number]  DATE:01/10/2007                            DOB:          23-Jul-1926    [name] is a 77 year old African American male who is here to schedule  colonoscopy. He has a history of coronary artery disease, remote PTCA of  the left LAD, status post primary PTCA for DMI  in 1994. He has  hypertension, hyperlipidemia, and lower extremity venous insufficiency.  He has been followed by Dr.  Victorino Dike for colon polyps. He had a  flexible sigmoidoscopy in 1995 and a full colonoscopy in 1996, in 1999  and 2004. During last colonoscopy, the patient underwent polypectomy  with findings of tubular adenoma. He remains asymptomatic. He denies  rectal bleeding. There is no family history of colon cancer.   CURRENT MEDICATIONS:  1. Zocor 20 mg p.o. daily.  2. Aspirin 81 mg p.o. daily.  3. Travatan.  4. Alphagan eye drops.  5. Amiodarone 200 mg p.o. daily.  6. Coumadin.  7. Magnesium oxide.  8. Lasix 40 mg p.o. daily.  9. Metoprolol 50 mg b.i.d.  10.Altace 10 mg daily.  11.Betimol eye drops.   PHYSICAL EXAMINATION:  Blood pressure 126/78, pulse 72, weight is 201  pounds. He is alert, oriented and in no distress.  LUNGS: Clear to auscultation.  COR: Normal S1, S2.  ABDOMEN: Soft, protuberant, somewhat obese. Nontender. Normoactive bowel  sounds.  RECTAL: Normal prostate. Stool was Hemoccult negative.   IMPRESSION:  A 77 year old [place] American male with a history of  adenomatous polyp of the colon, now anticoagulated with Coumadin.   PLAN:  According to AGA guidelines, his next colonoscopy should be in  five years from the time of the last colonoscopy which should be in June  2009. Before scheduling the colonoscopy next time, we will have to make  a decision whether he can discontinue his Coumadin prior to the  colonoscopy, or whether he ought to be done on Lovenox or possibly  admitted for  heparinization. There is a high risk that he will be having polyps since  he so far has had polyps on every colonoscopy since 1995. The patient  agrees to the plans.     Hedwig Morton. Juanda Chance, MD  Electronically Signed    DMB/MedQ  DD: 01/10/2007  DT: 01/10/2007  Job #: [id number]   cc:   Donia Guiles, M.D.  Bruce Elvera Lennox Juanda Chance, MD, [facility]

## 2010-12-02 NOTE — Assessment & Plan Note (Signed)
[facility]                            CARDIOLOGY OFFICE NOTE   NAME:[name]                       MRN:          [id number]  DATE:10/20/2007                            DOB:          1927/01/29    PRIMARY CARE PHYSICIAN:  Dr. Donia Guiles.   CLINICAL HISTORY:  [name] is 77 years old and returned for  management of his coronary heart disease and systolic heart failure.  He  has had a prior remote diaphragmatic wall infarction.  Has had multiple  percutaneous interventions and developed left ventricular dysfunction,  although his last echo showed normal ejection fraction.  He also has  atrial fibrillation which was associated with embolic stroke and is now  controlled on amiodarone with Coumadin treatment.   He says he has been doing fairly well recently.  Had no chest pain or  shortness of breath.  He says he has noted some increased edema and  swelling.   PAST MEDICAL HISTORY:  Significant for hypertension.   CURRENT MEDICATIONS:  1. Zocor 20 mg daily.  2. Aspirin 81 mg daily.  3. Amiodarone 200 mg daily.  4. Coumadin.  5. Lasix 40 mg daily.  6. Metoprolol 50 mg b.i.d.  7. Altace 10 mg daily.   PHYSICAL EXAMINATION:  VITAL SIGNS:  Blood pressure 125/80 with pulse 53  and regular.  NECK:  There was no venous distension.  Venous pulsation could be  questionably visible just at the clavicle.  Carotid pulses were full  without bruits.  CHEST:  Was clear without rales or rhonchi.  CARDIAC:  Rhythm was regular.  He had a short systolic murmur on the  left sternal edge.  ABDOMEN:  Protuberant.  There is no hepatosplenomegaly.  Bowel sounds  were normal.  Abdomen soft.  EXTREMITIES:  Showed 1 to 2+ edema.   IMPRESSION:  1. Coronary artery disease status post remote diaphragmatic wall      infarction and multiple percutaneous interventions.  2. Ischemic cardiomyopathy.  Injection fraction of 37% by Cardiolite      but improved to  55-60% by last echocardiogram. 3. History of      congestive heart failure. There is systolic and diastolic      dysfunction now borderline compensated.  3. Increased edema of the lower extremities possibly related to      congestive heart failure versus a venous insufficiency.  4. History of atrial fibrillation now controlled on amiodarone and      treated with Coumadin.  5. History of embolic stroke secondary to atrial fibrillation.  6. Hypertension.  7. Hyperlipidemia.   RECOMMENDATIONS:  [name] had some increased edema of the lower  extremities and borderline JVD.  I am not sure if this is related to  venous insufficiency or diastolic heart failure.  Will plan to increase  his Lasix from 40 to 80 for three days and then 60 mg a day.  Will get a  2-D echocardiogram to reevaluate his LV function.  Will also get  laboratory work including a CBC, BMP, BNP, TSH, liver function tests,  and  lipid profile.  This will give [place] surveillance for his amiodarone.  He also has had a lot of fatigue and asked for some vitamins.  We also  gave him a vitamin B complex.  He said he had some problem with his  Coumadin with Centrum vitamins which have vitamin K in them.  I will  plan to see him back in 7-8 weeks.     Bruce Elvera Lennox Juanda Chance, MD, [facility]  Electronically Signed    BRB/MedQ  DD: 10/20/2007  DT: 10/21/2007  Job #: [id number]

## 2010-12-02 NOTE — Assessment & Plan Note (Signed)
[facility]                                 ON-CALL NOTE   NAME:Paulette, Alick L                       MRN:          [id number]  DATE:03/19/2007                            DOB:          [birth year]    CARDIOLOGIST:  Everardo Beals. Juanda Chance, MD, F.A.C.C.   PHONE NUMBER:  [phone]   HISTORY:  Mr. Cwikla called the answering service today because he had  run out of his medications.  His prescriptions apparently have not been  sent to [facility], where he gets mail order from.  He is out of metoprolol  and simvastatin.   PLAN:  I called the patient's prescription for metoprolol 50 mg b.i.d.  and simvastatin 20 mg q.h.s. to [facility] at [phone] with two refills on  each.  I have also left a message at the office for someone to send off  his request to [facility].   DISPOSITION:  As noted above.     Tereso Newcomer, PA-C  Electronically Signed    SW/MedQ  DD: 03/19/2007  DT: 03/20/2007  Job #: [id number]   cc:   Everardo Beals. Juanda Chance, MD, [facility]

## 2010-12-02 NOTE — Assessment & Plan Note (Signed)
[facility]                            CARDIOLOGY OFFICE NOTE   NAME:[name], [name]                       MRN:          [id number]  DATE:04/20/2007                            DOB:          1926/09/18    PRIMARY CARE PHYSICIAN:  Donia Guiles.   CLINICAL HISTORY:  [name] is 77 years old and has coronary artery  disease with a previous diaphragmatic wall infarction and multiple  percutaneous interventions.  He also has a left ventricular dysfunction,  and has had a history of systolic congestive heart failure.  He also has  atrial fibrillation which is controlled on amiodarone.  He has a history  of an embolic stroke.  This has left him with some residual gait  disturbance.  He has been doing fairly well recently, has had no recent  chest pain, shortness of breath, palpitations, or swelling.   PAST MEDICAL HISTORY:  1. Hypertension.  2. Hypertension.   CURRENT MEDICATIONS:  1. Zocor.  2. Aspirin.  3. Amiodarone.  4. Coumadin.  5. Lasix.  6. Magnesium.  7. Metoprolol.  8. Altace.   PHYSICAL EXAMINATION:  Blood pressure is 116/80 and the pulse 54 and  regular.  There is no venous distension.  The carotid pulses were full with no  bruits.  The chest was clear without rales or rhonchi.  The cardiac rhythm was regular, I could hear no murmurs or gallops.  The abdomen was soft with normal bowel sounds.  Peripheral pulses were full and there is no peripheral edema.   Electrocardiogram showed left bundle branch block and left axis  deviation had not changed.   IMPRESSION:  1. Coronary artery disease, status post prior diaphragmatic wall      infarction and multiple percutaneous interventions, now stable.  2. Ischemic cardiomyopathy, an ejection fraction of 37% by Cardiolite,      and 55% to 60% by echo.  3. Congestive heart failure related to systolic and diastolic      dysfunction, now compensated.  4. History of atrial fibrillation,  now controlled on Coumadin and      amiodarone.  5. History of embolic stroke related to atrial fibrillation with      residual gait disturbance.  6. Hypertension.  7. Hyperlipidemia.  8. Venous insufficiency of the lower extremities.   RECOMMENDATIONS:  I think [name] is doing well.  Will plan to get  laboratory work as surveillance for amiodarone toxicity, and because of  his multiple medications, and this will include lipid and liver profile,  BNP and CBC and TSH.  I will plan to see him back in 6 months.  He will  need colonoscopy in the near future, but he has had a previous embolic  stroke, so he will need to be transitioned with Lovenox if we take him  off of Coumadin for the colonoscopy.  I have asked him to ask Dr. Lina Sar to call me prior to colonoscopy so we can arrange this  transition.     Bruce Elvera Lennox Juanda Chance, MD, [facility]  Electronically Signed    BRB/MedQ  DD: 04/20/2007  DT: 04/21/2007  Job #: [id number]   cc:   Donia Guiles, M.D.  Hedwig Morton. Juanda Chance, MD

## 2010-12-02 NOTE — Assessment & Plan Note (Signed)
[facility]                            CARDIOLOGY OFFICE NOTE   NAME:[name]                       MRN:          [id number]  DATE:12/06/2007                            DOB:          10/01/1926    PRIMARY CARE PHYSICIAN:  [name], M.D.   CLINICAL HISTORY:  [name] is 77 years old and returns for  management of his peripheral edema.  I saw him in April with increasing  edema and questionable JVD.  We were not sure if this was venous  insufficiency or heart failure.  We did order a 2-D echocardiogram and  BNP, and the 2-D echocardiogram shows ejection fraction of 50%, and the  BNP was only 158.  We increased his Lasix from 40 to 60, but he has had  no improvement.  He is not having any chest pain and no significant  shortness of breath.   PAST MEDICAL HISTORY:  Significant for:  1. Hypertension.  2. Paroxysmal atrial fibrillation.  3. Hyperlipidemia.   CURRENT MEDICATIONS:  1. Lasix 60 mg daily.  2. Zocor 20 mg daily.  3. Aspirin 81 mg daily.  4. Amiodarone 200 mg daily.  5. Coumadin.  6. Magnesium oxide.  7. Lasix 40 mg daily.  8. Toprol 50 mg b.i.d.  9. Altace 10 mg daily   PHYSICAL EXAMINATION:  VITAL SIGNS:  The blood pressure was 122/79,  pulse 59 and regular.  NECK:  There was no venous distention.  The carotids were full without  bruits.  CHEST:  Was clear.  CARDIAC:  Rhythm was regular.  I could hear no murmurs or gallops.  ABDOMEN:  Soft, although it was protuberant.  There was no  hepatosplenomegaly, and I could not detect any ascites.  EXTREMITIES:  There was 2+ edema bilaterally.   An electrocardiogram showed sinus rhythm with left bundle branch block.   IMPRESSION:  1. Lower extremity edema, probably related to venous insufficiency      aggravated by Norvasc. Rule out deep vein thrombophlebitis.  Rule      out nephrotic syndrome.  2. Coronary artery disease status post remote diaphragmatic wall  infarction and multiple percutaneous interventions.  3. Ejection fraction 50% by recent echocardiogram.  4. Paroxysmal atrial fibrillation, now controlled on amiodarone and      Coumadin.  5. History of embolic stroke secondary to atrial fibrillation.  6. Hypertension.  7. Hyperlipidemia.   RECOMMENDATIONS:  I think it is unlikely that the edema is related to  congestive heart failure.  I think it is more likely related to venous  insufficiency aggravated by Norvasc.  We will plan to stop the Norvasc.  His blood pressure is on the low side, so will not add anything since it  is problematic to increase his ACE inhibitors or his beta blockers.  Will increase his Lasix from 60-80 a day.  I will have him return in  about 5 days for a BNP as well as a serum albumin and a urinalysis to  check for urine protein.  Will also get venous Dopplers at that time to  rule out deep vein thrombophlebitis.  I will plan to see him back in  about 6 weeks and in followup after that.     [name] Elvera Lennox Juanda Chance, MD, [facility]  Electronically Signed    BRB/MedQ  DD: 12/06/2007  DT: 12/06/2007  Job #: [id number]   cc:   [name], M.D.

## 2010-12-02 NOTE — Assessment & Plan Note (Signed)
[facility]                            CARDIOLOGY OFFICE NOTE   NAME:Pell, JENTRY WARNELL                       MRN:          [id number]  DATE:01/17/2008                            DOB:          03-18-1927    PRIMARY CARE PHYSICIAN:  Donia Guiles, M.D.   CLINICAL HISTORY:  Mr. Recupero is a 77 years old who returned for  management of his coronary heart disease and diastolic heart failure.  I  had seen him in April and May with increasing edema and we were not  certain whether this was related to venous insufficiency, Norvasc, or  diastolic heart failure.  We did a 2D echo, which showed an ejection  fraction of 50% and he had BNP of 158.  We increased his Lasix and we  stopped his Norvasc.  He has done some better, although he still does  have persistent edema of the lower extremities.  He has had no chest  pain.  He has chronic shortness of breath with exertion.   PAST MEDICAL HISTORY:  Significant for hypertension, paroxysmal atrial  fib, and hyperlipidemia.   CURRENT MEDICATIONS:  1. Lasix 80 mg daily.  2. Zocor.  3. Aspirin.  4. Travatan.  5. Amiodarone.  6. Coumadin.  7. Mag oxide.  8. Metoprolol.  9. Altace.  10.Azopt.   PHYSICAL EXAMINATION:  VITAL SIGNS:  Blood pressure is 144/85 and pulse  52 and regular.  The venous pulsations were questionably visible at the  clavicle.  The carotid pulses were full.  CHEST:  Clear without rales or rhonchi.  HEART:  Rhythm was regular.  There were no murmurs or gallops.  ABDOMEN:  Soft with normal bowel sounds, however, was quite protuberant.  There was no hepatosplenomegaly.  EXTREMITIES:  There were 1+ edema of the lower extremities.  Pedal  pulses were equal.   DIAGNOSTIC DATA:  Electrocardiogram showed sinus bradycardia with left  bundle-branch block.   IMPRESSION:  1. Lower extremity edema, probably mostly related to venous      insufficiency, although there is probably an element of  diastolic      heart failure as well and this probably was previously aggravated      by Norvasc.  2. Coronary heart disease, status post remote diaphragmatic wall      infarction and multiple percutaneous coronary interventions.  3. Ejection fraction of 50%.  4. Paroxysmal atrial fibrillation, controlled on amiodarone and      Coumadin.  5. History of embolic stroke secondary to atrial fibrillation.  6. Hypertension.  7. Hyperlipidemia.  8. Chronic renal insufficiency with creatinine 1.9.   RECOMMENDATIONS:  I think Keshawn is doing reasonably well.  He still  does have edema of his lower extremity.  I am reluctant to go up on his  Lasix for this.  We will plan to use above-the-knee support hose.  Blood  pressure still not optimal.  We will add Apresoline 25 mg t.i.d. to his  current medications and come back for blood pressure check in 1 week.  Otherwise, I will see him in 6 months.  Bruce Elvera Lennox Juanda Chance, MD, [facility]  Electronically Signed    BRB/MedQ  DD: 01/17/2008  DT: 01/18/2008  Job #: [id number]

## 2010-12-05 NOTE — Discharge Summary (Signed)
[name], [name]                ACCOUNT NO.:  [id number]   MEDICAL RECORD NO.:  [id number]          PATIENT TYPE:  ORB   LOCATION:  4504                         FACILITY:  [facility]   PHYSICIAN:  Charlies Constable, M.D. [facility] DATE OF BIRTH:  1926-12-23   DATE OF ADMISSION:  06/04/2005  DATE OF DISCHARGE:  06/16/2005                                 DISCHARGE SUMMARY   PRINCIPAL DIAGNOSIS:  Congestive heart failure.   OTHER DIAGNOSES:  1.  Atrial fibrillation with rapid ventricular response.  2.  Right to mid brain pons and cerebellum cardioembolic stroke.  3.  Left upper extremity cellulitis.  4.  Hypertension.  5.  Hyperlipidemia.  6.  Coronary artery disease.  7.  Benign prostatic hypertrophy.   PROCEDURES:  None.   ALLERGIES:  No known drug allergies.   PRIMARY CARDIOLOGIST:  Charlies Constable, M.D. [facility]   HISTORY OF PRESENT ILLNESS:  A 77 year old African American male who was  admitted to [facility] from May 25, 2005, to June 03, 2005,  with shortness of breath and heart failure. During an admission he was felt  not to have pulmonary emboli by pulmonary angiography. He was noted to have  atrial flutter with rapid ventricular response requiring cardioversion as  well as the initiation of amiodarone therapy. Finally, during that admission  he suffered a mid brain pons and cerebellar cardioembolic stroke requiring  neurology evaluation. The patient was discharged to the subacute care unit  on June 03, 2005, for rehabilitation and further care of a left arm  cellulitis that developed two days prior to discharge.   REHABILITATION COURSE:  The patient underwent extensive rehabilitation with  physical and occupational therapies.  He had significant improvement of left  upper extremity cellulitis with Avelox oral therapy. Anticoagulation for his  atrial fibrillation was held secondary to recent stroke with plans to  initiate Coumadin therapy on June 18, 2005.  Physical therapy evaluated  him this morning and felt that he was ready for discharge with a walker and  he is being discharged home today in satisfactory condition.   DISCHARGE LABORATORY:  Hemoglobin 14.4, hematocrit 42.9, WBC 13.1, platelet  count 374,000. Sodium 141, potassium 4.0, chloride 110, CO2 23, BUN 13,  creatinine 1.6, glucose 92, calcium 8.7, magnesium 2.3.   DISPOSITION:  The patient is being discharged home today in good condition.   FOLLOWUP PLANS AND APPOINTMENTS:  He is to follow up with Dr. Charlies Constable,  his nurse practitioner, or PA on July 02, 2005, at 12 noon. He also has  follow-up at the [facility] on December 6th at 9:30  a.m.   DISCHARGE MEDICATIONS:  1.  Altace 5 mg daily.  2.  Amiodarone 2 mg daily.  3.  Coumadin 2.5 mg q.h.s. to be started on June 18, 2005.  4.  Lopressor 50 mg b.i.d.  5.  Plendil 10 mg daily.  6.  Lasix 20 mg daily.  7.  Aspirin 81 mg daily.  8.  Plavix 75 mg daily.  9.  Zocor 20 mg q.h.s.  10. Magnesium oxide 40  mg b.i.d.  11. Trusopt 2% one drop in each eye b.i.d.  12. Travatan 0.004% one drop each q.h.s.   OUTSTANDING LABORATORY STUDIES:  None.   DURATION OF DISCHARGE ENCOUNTER:  60 minutes including physician time.      Ok Anis, NP    ______________________________  Charlies Constable, M.D. [facility]    CRB/MEDQ  D:  06/16/2005  T:  06/16/2005  Job:  [id number]   cc:   Charlies Constable, M.D. [facility]  [address]  [address]  [place]  [place] [address]

## 2010-12-05 NOTE — Cardiovascular Report (Signed)
[place]  Patient:    [name], [name] Visit Number: [id number] MRN: [id number]          Service Type: MED Location: [phone] Attending Physician:  Nathen May Dictated by:   Doylene Canning. Ladona Ridgel, M.D. [facility] Proc. Date: 08/01/01 Admit Date:  07/30/2001   CC:         [facility]   Cardiac Catheterization  PROCEDURE:  Left heart catheterization with coronary angiography and left ventriculography.  INDICATION:  Unstable angina.  INTRODUCTION:  The patient is a very pleasant 77 year old man with a history of chest pain, who was admitted to the hospital with worsening chest pain.  He is now referred for left heart catheterization.  The patient has had previous myocardial infarctions and catheterizations, the last in 1994.  DESCRIPTION OF PROCEDURE:  After informed consent was obtained, the patient was taken to the diagnostic catheterization lab in the fasted state.  After the usual preparation and draping, a total of 30 cc of lidocaine was infiltrated into the right groin region.  The right femoral artery was punctured and the 7 French sheath placed in the artery.  The left Judkins catheter was inserted by way of the sheath and advanced into the left main coronary artery.  Coronary angiography of the left main system was then carried out.  The left Judkins catheter was removed, and the right Judkins catheter was placed in the right coronary artery, and coronary angiography of the right coronary system was carried out.  The right Judkins catheter was removed and the pigtail catheter was inserted retrograde across the aortic valve into the left ventricle, and left ventriculography in the RAO projection was carried out.  At this point the catheter was removed and the patient was referred for angioplasty.  COMPLICATIONS:  None.  RESULTS:  A. HEMODYNAMICS:  The LV pressure was 148/10, the aortic pressure  148/72.  B. LEFT VENTRICULOGRAPHY:  Left ventriculography was performed in the RAO    projection with a total of 30 cc of contrast delivered.  This demonstrated    normal LV function with no segmental wall motion abnormalities.  C. CORONARY ANGIOGRAPHY:  The left main coronary artery had no obstructive    lesions.  It gave rise to the left anterior descending and left circumflex    coronary arteries.  The left anterior descending had a proximal 99%    stenosis with TIMI 2 flow.  The two diagonal branches were diseased with    the first diagonal branch having multiple 60 and 70% stenoses and at the    branch point of the first diagonal, an 80-90% stenosis was seen in the    ostial portion of the superior branch.  The second diagonal had    nonobstructive diffuse disease.  The left circumflex artery had multiple    plaquing lesions of 20-30%.  It gave rise to two obtuse marginal branches,    which were free of obstructive disease.  The right coronary artery was a    dominant vessel supplying the PDA.  It had multiple 20-30% plaquing lesions    but no obstructive disease.  CONCLUSION:  This study demonstrates severe single-vessel coronary disease with branch vessel disease as well.  The LV function was preserved.  Because of the patients unstable angina, he will be referred for percutaneous coronary intervention. Dictated by:   Doylene Canning. Ladona Ridgel, M.D. [facility] Attending Physician:  Nathen May DD:  08/01/01  TD:  08/01/01 Job: [id number]

## 2010-12-05 NOTE — Consult Note (Signed)
NAMEKHIYAN, CRACE                ACCOUNT NO.:  [id number]   MEDICAL RECORD NO.:  [id number]          PATIENT TYPE:  INP   LOCATION:  4741                         FACILITY:  [facility]   PHYSICIAN:  Santina Evans A. Orlin Hilding, M.D.DATE OF BIRTH:  1926/11/10   DATE OF CONSULTATION:  05/29/2005  DATE OF DISCHARGE:                                   CONSULTATION   REASON FOR CONSULTATION:  Gait problems and disconjugate gaze.   CHIEF COMPLAINT:  Trouble walking and vision problems.   HISTORY OF PRESENT ILLNESS:  Mr. Geil is a 77 year old African-American man  who presented to the emergency room with several days history of shortness  of breath. At the time he was seen in the emergency room, he was in normal  sinus rhythm. He was admitted to workup for myocardial infarction and  pulmonary embolus. There was some concern about that, with an elevated BNP.  Also there was concern about possible congestive heart failure. While he was  in the middle of his evaluation, he went into atrial fibrillation with a  rapid ventricular rate. He was cardioverted and I gather, briefly was in  normal sinus but then reverted to atrial fibrillation. He is supposed to  have a catheterization today. However, in the last few days, perhaps late  last night or possibly today, he was noted to have some problems with his  walking and had complained of some visual problems.   REVIEW OF SYSTEMS:  Positive for the visual problems and the shortness of  breath as already outlined.   PAST MEDICAL HISTORY:  Significant for his new diagnosis of paroxysmal  atrial fibrillation with rapid ventricular rate, which was recalcitrant to  cardioversion. He has coronary artery disease with stents. Prior MI. He has  hypertension, hyperlipidemia, and benign prostatic hypertrophy.   MEDICATIONS:  At home included Lopressor, Altace, aspirin, Plavix, and  Zocor.   While in the hospital, he has been on acetylcysteine, aspirin, Plavix,  Trusopt, Lovenox, Lasix, magnesium oxide, Avelox, Altace, Senokot, Zocor,  and also on amiodarone.   ALLERGIES:  No known drug allergies.   SOCIAL HISTORY:  He is a nonsmoker. Does drink alcohol.   FAMILY HISTORY:  Positive for coronary artery disease. He has worked as a  Midwife.   PHYSICAL EXAMINATION:  VITAL SIGNS:  Temperature 98.7, pulse is in the 70's  to 80's. Respiratory rate 18 to 20, blood pressure 135 to 151 systolic over  83 to 97 diastolic.  NEUROLOGIC:  Mental status:  He is awake, alert, appropriate, and  cooperative. He mumbles a little bit but no language deficits per day are  noted. Cranial nerves, visual fields appear intact in each eye. Extraocular  movements are abnormal. He demonstrates what appears to be at least a right  intranuclear ophthalmoplegia, probably also a left intranuclear  ophthalmoplegia, which is not quite as pronounced, giving him bilateral  I&O's. He also has a vertical gaze paresis with up-gaze affected more than  down-gaze. It is difficult to tell whether he is experiencing significant  diplopia. Cannot really get him to tell me that he  is seeing 2 items instead  of 1. Facial sensation is normal and facial motor activity is normal. He has  a tendency to squint on the right eye but I do not believe that he has a  true ptosis. Hearing is intact. He is mildly dysarthric. His tongue is  fairly midline, perhaps minimally deviated to the right but I think overall  it is normal with normal movement. Motor examination, he has some drift in  the left upper extremity with definite satelliting of that. Normal on the  right with no drift. No drift in either lower extremities. On attempting to  walk, he has a wide based gait and is wobbling back and forth before even  starting to walk with an ataxic gait. Coordination, he has normal finger to  nose on the left. He has dysmetria with finger to nose on the right. He does  move with heel to shin on both  sides.   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   IMPRESSION:  I suspect that this is a cardioembolic stroke to the right mid  brain pons and cerebellum. His examination is very consistent with the  distribution of the stroke seen on MRI. Radio and echo sequences, which were  done, did show a little bit of petechial hemorrhage, which is not apparent  on the other pulse sequences and is not frankly hemorrhagic but that does  raise some concerns about anticoagulation.   RECOMMENDATIONS:  Would not heparinize at this time. He will need ST, OT,  and PT evaluations, swallow evaluation. Will need to discontinue Lovenox,  heparin, or Coumadin for now. I will leave it to Dr. Regino Schultze discretion,  whether he wishes to continue the Plavix and aspirin if the patient passes  the swallow study. I do not think, given the minimal petechial hemorrhage,  that this would be an absolute contraindication. He may need a  rehabilitation stay. Stroke team may decide to recommend further evaluation,  although this seems fairly clearly cardioembolic event, given the  circumstances.      Catherine A. Orlin Hilding, M.D.  Electronically Signed     CAW/MEDQ  D:  05/29/2005  T:  05/30/2005  Job:  [id number]

## 2010-12-05 NOTE — H&P (Signed)
[name], [name]                ACCOUNT NO.:  [id number]   MEDICAL RECORD NO.:  [id number]          PATIENT TYPE:  EMS   LOCATION:  MAJO                         FACILITY:  [facility]   PHYSICIAN:  Olga Millers, M.D. LHCDATE OF BIRTH:  March 11, 1927   DATE OF ADMISSION:  05/25/2005  DATE OF DISCHARGE:                                HISTORY & PHYSICAL   HISTORY OF PRESENT ILLNESS:  The patient is a 77 year old male patient of  Dr. Smitty Cords Brodie's with a past medical history of coronary disease,  hypertension, hyperlipidemia, and benign prostatic hypertrophy, who presents  with complaints of exertion dyspnea and fatigue.  His last catheterization  was in 2003, and he had PCI of his LAD at that time.  Over the past 3 days,  he complains of progressive dyspnea on exertion, as well as fatigue with  exertion.  There is a mild abdominal discomfort with exertion, as well.  However, he denies any dyspnea at rest, nor is there orthopnea or PND, and  he denies any chest pain.  It should be noted he is a difficult historian.  He has chronic pedal edema, which is unchanged.  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  He has not had recent trauma to his legs.   ALLERGIES:  No known drug allergies.   MEDICATIONS:  1.  Lopressor 50 mg p.o. b.i.d.  2.  Altace 10 mg p.o. daily.  3.  Aspirin 81 mg p.o. daily.  4.  Plavix 75 mg p.o. daily.  5.  Zocor 20 mg p.o. nightly.   SOCIAL HISTORY:  He does not smoke, but he does consume alcohol.  There is  at least 1 alcoholic beverage per day.   FAMILY HISTORY:  Positive for coronary artery disease.   PAST MEDICAL HISTORY:  1.  Significant for hypertension.  2.  Hyperlipidemia.  3.  He does have a history of coronary disease.  His last catheterization      was in January of 2003.  At that time, he was found to have normal LV      function.  There was a proximal 99% LAD lesion in the first diagonal.      Had 60% to 70% stenosis, as well as  an 80% to 90% in the osteal portion      of the superior branch.  The remaining disease was non-obstructive.  He      had rotational atherectomy of his LAD at that time.  4.  Benign prostatic hypertrophy, and has had previous surgery for that.      There is no other past medical history noted.   REVIEW OF SYSTEMS:  He denies any headaches or fevers or chills.  There is a  chronic cough, but it is non-productive.  There is no hemoptysis.  He denies  any dysphagia, odynophagia, melena, or hematochezia.  There is no dysuria or  hematuria.  There is no rash or seizure activity.  There is no orthopnea or  PND, but there is chronic pedal edema.  The remaining symptoms are negative.  PHYSICAL EXAMINATION:  VITAL SIGNS:  Blood pressure is 120/79, pulse 78.  He  is afebrile.  GENERAL:  He is well-developed and well-nourished, in no acute distress.  SKIN:  Warm and dry.  He is not __________, and there is no peripheral  clubbing.  HEENT:  Unremarkable with normal eyelids.  NECK:  Supple with normal __________ bilaterally, and I cannot appreciate  bruits.  There is no jugular venous distention.  I cannot appreciate  thyromegaly.  CHEST:  Clear to auscultation.  Normal expansion.  CARDIOVASCULAR:  Regular rate and rhythm.  Normal S1 and S2.  I could not  appreciate murmurs, rubs, or gallops.  ABDOMEN:  Nontender, nondistended.  Positive bowel sounds.  No  hepatosplenomegaly.  No masses appreciated.  There is no abdominal bruit.  He has 2+ femoral pulses bilaterally.  No bruits.  EXTREMITIES:  There was 1+ edema at the ankles bilaterally.  His distal  pulses are diminished.  NEUROLOGIC:  Grossly intact.   His electrocardiogram shows a sinus rhythm at a rate of 75.  The axis is  normal.  There are inferolateral T wave changes, which is unchanged from  previous.   LABORATORY DATA:  His white blood cell count is 12.2 with hemoglobin of 13.4  and hematocrit of 40.  His platelet count is 148.  His  BUN and creatinine  are 18 and 1.6.  His D-dimer is elevated at 3.18.  His BNP is elevated at  942.  His chest x-ray shows basilar atelectasis.   DIAGNOSES:  1.  New onset dyspnea on exertion.  2.  Elevated D-dimer.  3.  History of coronary disease.  4.  Hypertension.  5.  Hyperlipidemia.  6.  History of benign prostatic hypertrophy.  7.  Mild renal insufficiency.   PLAN:  Mr. [name] presents with complaints of fatigue and dyspnea on  exertion.  His D-dimer is elevated, and his occupation is a bus driver.  I  think there is a strong possibility of pulmonary embolus.  We will check a  CT to exclude this.  We will add Lovenox to his medical regimen.  We will  need to follow his renal function closely following the CT to make sure he  does not develop contrast nephropathy.  His BNP is also elevated, but if the  patient does have a pulmonary embolus, then this elevated BNP may be  elevated to RV strain.  We will check an echocardiogram both to quantify LV  and RV function.  I will also plan gentle diuresis.  We will rule out  myocardial infarction, although I think ischemia is less likely.  We will  continue his aspirin, Plavix, Lopressor, Altace, and statin.  If the above  studies are negative, then I would proceed with a Myoview to exclude  symptoms as an anginal equivalent.           ______________________________  Olga Millers, M.D. [facility]     BC/MEDQ  D:  05/25/2005  T:  05/26/2005  Job:  [id number]

## 2010-12-05 NOTE — Assessment & Plan Note (Signed)
[facility]                            CARDIOLOGY OFFICE NOTE   NAME:[name], [name]                       MRN:          [id number]  DATE:11/04/2006                            DOB:          04/10/1927    [name] is 77 years old and is here today in followup for hypertension  and a history of coronary artery disease.  He was hospitalized with  congestive heart failure in 2006 and last had cardiac catheter  intervention in 2003.  He recently had amlodipine added to his  medication regimen for hypertension and is here today in followup.   [name] states that he does not get chest pain.  He gets dyspnea on  exertion which is consistent, but he is able to walk 15 minutes at a  time without having to stop.  He feels like that he does not notice his  shortness of breath until he stops and when he stops, he realizes that  he is breathing hard.  His symptoms resolve in less than 5 minutes.  He  is able to do his activities of daily living.  Because of a prior CVA,  his ambulation is slightly limited, but he states that he gets around  well with a cane.  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  He states his weight is up a little bit over the winter  because during the winter his activity level decreases.  He has no other  complaints.   PAST MEDICAL HISTORY:  1. Coronary artery disease:  He is status post percutaneous      intervention to the LAD in 1987 and twice in 1988, as well as      directional coronary atherectomy and percutaneous transluminal      coronary angioplasty, January 2003.  He had a diaphragmatic MI in      1994 with angioplasty of the RCA.  2. Paroxysmal atrial fibrillation.  3. Status post cardioversion in November 2006 with an embolic CVA      after that.  4. Hypertension.  5. Chronic anticoagulation with Coumadin.  6.  Hyperlipidemia.  7. History of lower extremity edema, probably secondary to venous      insufficiency.  8. History of congestive heart failure during an admission for atrial      fibrillation and CVA, normal systolic function by echocardiogram in      September 2007.  9. Status post adenosine Cardiolite on April 01, 2006 showing      prior distal anterior and inferior infarcts, but no ischemia, EF      37%.   CURRENT MEDICATIONS:  1. Zocor 20 mg a day.  2. Aspirin 81 mg a day.  3. Travatan, Alphagan and Betimol eye drops.  4. Amiodarone 200 mg a day.  5. Coumadin as directed.  6. Mag-Ox 400 mg b.i.d.  7. Lasix 40 mg a day.  8. Metoprolol 50 mg b.i.d.  9. Altace 10 mg a day.  10.Amlodipine  5 mg daily.  11.Vigamox, Omni-Pred and Nevanac eye drops as directed.   PHYSICAL EXAM:  VITAL SIGNS:  His weight is 199 pounds, which is 4  pounds higher than it was in December.  Blood pressure is 132/94 in the  left arm, heart rate 59 and regular.  GENERAL:  He is a well-developed, obese African American male in no  acute distress.  NECK:  There is no JVD, no thyromegaly and no carotid bruits are  appreciated.  CHEST:  Clear to auscultation bilaterally.  CV:  His heart is regular in rate and rhythm with no significant murmur,  rub or gallop.  ABDOMEN:  Soft and nontender with active bowel sounds.  EXTREMITIES:  Distal pulses are 2+ in all 4 extremities; no edema is  noted.   IMPRESSION:  1. History of coronary artery disease:  [name] had residual      coronary artery disease in 2003 of 60%-70% in the first diagonal      and 80% in a diagonal branch as well as 20%-30% lesions in the      circumflex.  He is having no ischemic symptoms and is compliant      with his medications.  His blood pressure control is slightly      suboptimal and he is encouraged to increase his activity as      tolerated.  2. Hypertension:  His control is slightly suboptimal and we will      increase his  amlodipine from 5 mg to 10 mg daily.  He will get a      blood pressure check in a week and follow up with Dr. Juanda Chance in 6      months.  3. Hyperlipidemia:  He is compliant with Zocor at 20 mg a day.  He had      a lipid profile in August 2007 done by Dr. Arvilla Market; he is to      follow up with Dr. Arvilla Market for this.  4. Anticoagulation:  We will get a pro time today and he will continue      to take Coumadin as directed and follow up at the [facility].  5. 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      [name] Demark, PA-C  Electronically Signed      Everardo Beals Juanda Chance, MD, [facility]  Electronically Signed   RB/MedQ  DD: 11/04/2006  DT: 11/05/2006  Job #: [id number]

## 2010-12-05 NOTE — Assessment & Plan Note (Signed)
[facility]                                   ON-CALL NOTE   NAME:Mcgregory, Kamon                         MRN:          [id number]  DATE:02/27/2006                            DOB:          [birth year]    HISTORY:  Mr. Volner is a 77 year old male who called on the morning of  February 27, 2006, at approximately 11:50 complaining of ankle and foot  swelling.  Mr. Sterling states that he always has occasional edema at the end  of the day.  However, throughout the night, it was decreased and by the  following morning, it is resolved.  He states that last night, while at  church, he noticed some lower extremity swelling.  This morning, he got up.  He did not pay any attention to it, and now that he has been cleaning the  bathrooms, his house, and doing a lot of housework, and has been on his feet  for several hours, he has noticed that his feet are swollen.  He was  wondering what to do about this.  He denies any changes in his chronic  shortness of breath, orthopnea, PND, and states that his weight has been  maintained between 180 and 181 pounds for the last several days.   Mr. Savino has a history of congestive heart failure, atrial flutter with  cardioversion complicated by embolic stroke.  He also has a history of  hypertension, hyperlipidemia, and chronic venous insufficiency of the lower  extremities.   I reassured Mr. Mccarry that it did not sound like he was having congestive  heart failure issues.  I reassured him that he probably just did not notice  that his lower extremity edema had improved overnight and as he has been up  all morning, that the edema had returned.  I also assured the patient that  it would be okay if he did take one extra dose of Lasix (20 mg).  I also  explained to him if he should develop problems with shortness of breath,  inability to lay down or a weight gain, that he should call us back.  I also  asked him to pay particular  attention to his ankles when he gets up in the  morning to also let us know if he had further edema.                                   Joellyn Rued, PA-C   EW/MedQ  DD:  02/27/2006  DT:  02/28/2006  Job #:  [id number]

## 2010-12-05 NOTE — Assessment & Plan Note (Signed)
[facility]                              CARDIOLOGY OFFICE NOTE   NAME:[name], [name] PLOTTS                       MRN:          [id number]  DATE:06/01/2006                            DOB:          [birth year]    This is an elderly 77 year old African-American male patient of Dr. Charlies [name] who is here for clearance prior to trabeculectomy of his left eye.  [name] has a history of congestive heart failure with normal systolic  function, paroxysmal atrial fibrillation controlled with amiodarone, and  suffered an embolic stroke following cardioversion last November of 2006.  He has coronary artery disease status post an inferior wall MI and multiple  coronary interventions.  He also has hypertension.   CURRENT MEDICATIONS:  1. Zocor 20 mg daily.  2. Aspirin 81 mg daily.  3. Azopt eye drops.  4. Travatan eye drops.  5. Amiodarone 200 mg daily.  6. Coumadin as directed.  7. Magnesium oxide b.i.d.  8. Lasix 20 mg daily.  9. Metoprolol 50 mg b.i.d.  10.Altace 5 mg daily.  11.Betimol for his eyes.   PAST MEDICAL HISTORY:  1. Please see above dictation.  2. He also has a history of venous insufficiency to his lower extremities.   REVIEW OF SYSTEMS:  Negative for dizziness or presyncopal signs or symptoms,  dyspepsia, dysphagia, nausea, vomiting, change in bowels or melena.  CARDIOPULMONARY:  See HPI.   PHYSICAL EXAMINATION:  GENERAL:  This is a pleasant 77 year old African-  American male in no acute distress.  VITAL SIGNS:  Blood pressure is elevated today at 182/104, pulse is 58.  NECK:  Without JVD, hepatojugular reflux, bruit or thyroid enlargement.  LUNGS:  Decreased breath sounds at the left base.  Clear elsewhere without  wheezing, rales or rhonchi.  HEART:  Regular rate and rhythm at 60 beats per minute.  Normal S1 and S2.  Positive S4.  No significant murmur, rub, bruit, thrill or heave noted.  ABDOMEN:  Protuberant.  Normoactive bowel  sounds are heard throughout.  EXTREMITIES:  Trace of ankle edema bilaterally.  Decreased distal pulses.  HEENT:  His tongue has a hematoma from biting his tongue on his right  lateral side.  This does not affect his eating.   ELECTROCARDIOGRAM:  Normal sinus rhythm with left bundle branch block and  PVC's.  No acute change from prior tracings.   IMPRESSION:  1. Need for trabeculectomy of the left eye on June 07, 2006.  2. Hypertension, uncontrolled.  3. History of embolic stroke after cardioversion for atrial fibrillation      in November of 2006.  4. Coumadin therapy.  5. Congestive heart failure with normal systolic function in the summer.  6. Coronary artery disease status post inferior well myocardial infarction      with multiple coronary interventions.  Most recent adenosine Cardiolite      on April 01, 2006 is a low-risk stress study with prior distal      anterior and inferior infarctions.  No ischemia.  Ejection fraction of      37%.  Ejection  fraction on 2-D echocardiogram on April 01, 2006 was      55-60%.  7. Hyperlipidemia.  8. Venous insufficiency of the lower extremities.   PLAN AT THIS TIME:  The patient is at high risk to come off his Coumadin.  He will need a Lovenox bridge because of his prior embolic stroke.  His  blood pressure is high today.  I have increased his Lasix to 40 a day and  Altace to 10 mg a day.  He is to come in Friday for a blood pressure check.  He is going to the [facility] today for INR and Lovenox bridge, and we  are trying to contact [facility] to see if they indeed do need him off his  Coumadin for this surgery.   This was discussed with Dr. Glennon [name], who agreed with the Lovenox bridge.      [name] Reedy, PA-C  Electronically Signed      [name] Cranker, MD, [facility]  Electronically Signed   ML/MedQ  DD: 06/01/2006  DT: 06/01/2006  Job #: [id number]   cc:   [name], M.D.

## 2010-12-05 NOTE — Assessment & Plan Note (Signed)
[facility]                                   ON-CALL NOTE   NAME:[name], [name]                         MRN:          [id number]  DATE:06/10/2006                            DOB:          1926/08/27    TIME OF CALL:  At 3:49 p.m.   I received a telephone call to the answering service from Kim at [phone] concerning Ranae Pila.  She stated Mr. Royce was not well.  He had  brushed his teeth and noticed there was blood on his toothbrush after  brushing his teeth.  I talked with Selena Batten and also with Mr. Bergfeld.  Selena Batten states  that Mr. Gallardo had his INR checked yesterday, was told it was too low, and  was given an additional dose of Coumadin.  I do not have a record from the  Coumadin Clinic to confirm this, and the patient had been doing well.  He  just now was brushing his teeth and he noticed the toothpaste was pink.  I  asked to speak with Mr. Massmann.  Upon further discussion, Mr. Villavicencio  remembered he had just had a strawberry lollipop not too long before he  brushed his teeth, and had not drunk anything since eating the lollipop.  I  instructed him to continue to monitor his mouth, make sure he was using a  soft toothbrush.  If he noted any hemoptysis or epistaxis, or further gum  bleeding, he was to call back to the answering service for further  instructions.  At this time, however, I feel like his pink toothpaste is  probably coming from the lollipop he was eating.  I gave Selena Batten the same  instructions.      Dorian Pod, ACNP  Electronically Signed      Gerrit Friends. Dietrich Pates, MD, [facility]  Electronically Signed   MB/MedQ  DD: 06/10/2006  DT: 06/10/2006  Job #: [id number]

## 2010-12-05 NOTE — Consult Note (Signed)
[name], [name]                ACCOUNT NO.:  [id number]   MEDICAL RECORD NO.:  [id number]          PATIENT TYPE:  INP   LOCATION:  4741                         FACILITY:  [facility]   PHYSICIAN:  Santina Evans A. Orlin Hilding, M.D.DATE OF BIRTH:  1926/10/13   DATE OF CONSULTATION:  DATE OF DISCHARGE:                                   CONSULTATION   Audio too short to transcribe (less than 5 seconds)      Catherine A. Orlin Hilding, M.D.     CAW/MEDQ  D:  05/29/2005  T:  05/29/2005  Job:  [id number]

## 2010-12-05 NOTE — Procedures (Signed)
[place]  Patient:    [name], [name] Visit Number: [id number] MRN: [id number]          Service Type: MED Location: [phone] Attending Physician:  Nathen May Dictated by:   Everardo Beals Juanda Chance, M.D. [facility] Proc. Date: 08/01/01 Admit Date:  07/30/2001   CC:         Dr. Franchot Erichsen  Cardiac Catheterization Lab   Procedure Report  CLINICAL HISTORY:  [name] is 77 years old and has had documented coronary disease with a remote PTCA to the right coronary artery which I believe was done following a diaphragmatic wall infarction.  He has done well until recently and was admitted with chest pain felt to represent unstable angina.  He was studied earlier today by Dr. Ladona Ridgel who found a high-grade lesion in the proximal LAD with TIMI-2 flow with involvement of the diagonal branch.  The circumflex and right coronary arteries had no major obstruction.  DESCRIPTION OF PROCEDURE:  The procedure was performed via the right femoral artery.  We exchanged the old sheath for an #8 [place] sheath.  The patient was given weight-adjusted heparin to prolong an ACT of greater than 200 seconds. We also gave double bolus Integrilin and infusion.  We made a decision to treat the lesion with directional atherectomy since it involved the diagonal branch and there was a sharp angle into the diagonal branch and we felt that stenting the lesion would surely compromise the diagonal branch severely.  For this reason we used a JCL4 #8 [place] guiding catheter and a 3.0-3.4 Flexi-Cutter.  We were able to advance the Cutter into the left main without too much difficulty.  We performed approximately eight inflations up to two atmospheres just proximal to and just at the diagonal branch in a circumferential fashion.  We then removed the Cutter and the wire and obtained a moderate amount of atheromatous material.  We then went back in and with the idea of  directing the cutter just opposite the diagonal branch.  We performed another four cuts at 2 psi in a circumferential fashion.  The Cutter and wire were then removed and repeat diagnostic studies were performed through the guiding catheter.  The patient tolerated the procedure very well with only minimal pain during positioning of the cutter.  RESULTS:  Initially the stenosis in the proximal LAD was estimated at 99% with TIMI-2 flow.  Following directional atherectomy, the stenosis improved to 10% with TIMI-3 flow.  There appeared to be a very small nonobstructive edge tear on the side of the vessel away from the diagonal branch and proximal to the diagonal branch but we did not feel this was enough to warrant stenting.  The diagonal branch had ostial involvement that was about 70% and had diffuse disease in its mid portion of 70-80%.  CONCLUSIONS:  Successful directional atherectomy of the lesion in the proximal left anterior descending artery with improvement in percent area narrowing from 99% to 10% and improvement in the flow from TIMI-2 to TIMI-3 flow.  DISPOSITION:  The patient was transferred to the post angioplasty unit for further observation. Dictated by:   Everardo Beals Juanda Chance, M.D. [facility] Attending Physician:  Nathen May DD:  08/01/01 TD:  08/01/01 Job: [id number]

## 2010-12-05 NOTE — Discharge Summary (Signed)
NAMEARVIN, [name]                ACCOUNT NO.:  [id number]   MEDICAL RECORD NO.:  [id number]          PATIENT TYPE:  INP   LOCATION:  4741                         FACILITY:  [facility]   PHYSICIAN:  Charlies Constable, M.D. [facility] DATE OF BIRTH:  1927-03-26   DATE OF ADMISSION:  05/25/2005  DATE OF DISCHARGE:  06/03/2005                                 DISCHARGE SUMMARY   PRINCIPAL DIAGNOSIS:  Congestive heart failure.   OTHER DIAGNOSES:  1.  Atrial fibrillation with rapid ventricular response.  2.  Right mid brain pons and cerebellum cardioembolic stroke.  3.  Left upper extremity cellulitis.  4.  Hypertension.  5.  Hyperlipidemia.  6.  Coronary artery disease.  7.  Benign prostatic hypertrophy.   PROCEDURE:  1.  DCCV.  2.  MRI of the brain.  3.  Two-D echocardiogram.   ALLERGIES:  No known drug allergies.   HISTORY OF PRESENT ILLNESS:  A 77 year old African-American male with prior  history of coronary disease, hypertension, hyperlipidemia, benign prostatic  hypertrophy who presented on May 25, 2005 with three day history of  progressive dyspnea on exertion, fatigue, and abdominal discomfort. In the  ED, cardiac markers were negative with an elevated D-dimer of 3.18. he was  admitted for further evaluation.   HOSPITAL COURSE:  Secondary to elevated D-dimer, the patient underwent a  chest CT which was severely limited by motion, and therefore the study could  not competently exclude pulmonary emboli. He then underwent nonselective  pulmonary angiography which showed no evidence of pulmonary emboli. An  echocardiogram was performed on November 9 revealing an EF of 55 to 65% with  normal wall motion and 1+ MR. Early in the morning on November 8, he  developed atrial flutter with rapid ventricular response. He was initiated  on IV diltiazem, and digoxin was also added without adequate heart rate  control. Therefore, he was successfully cardioverted later in the day on  November 8.  Following cardioversion, he was maintained on IV amiodarone at 1  mg per minute and was able to maintain sinus rhythm. On November 10, he was  noted to have intermittent confusion and unsteady gait. Neurology was  consulted, and MRI/MRA of the head was performed revealing a hemorrhagic  right superior cerebellar infarct involving the medial right superior  cerebellar lobe. At this point considering the patient's atrial  fibrillation, it was determined to hold off on anticoagulation for at least  two weeks. He was further studied including the forearm and biceps, carotid  and transcranial Dopplers revealing no significant plaque and antegrade  vertebral artery flow.   On November 13, he was noted to have a swollen and warm left upper extremity  felt to be cellulitis secondary to previous IV site. He was given 1 dose of  IV vancomycin, and warm compresses were applied. He has been working with  physical therapy and occupational therapy in the [place]. [facility] was consulted.  He was accepted for a bed today, and he is being transferred to [facility] today  for rehabilitation.   DISCHARGE LABORATORY DATA:  Hemoglobin 13.2, hematocrit 39.0,  WBC 14.6,  platelets 162, MCV 83.0. Sodium 137, potassium 3.7, chloride 106, CO2 26,  BUN 11, creatinine 11.4, glucose 97. PT 14.6, INR 1.1, PTT 34. Troponin  0.05. Calcium 8.4. Hemoglobin A1c 5.9. Homocysteine 18.7. TSH 0.384.   DISPOSITION:  The patient is being discharged to [facility] today in fair  condition.   FOLLOWUP PLANS AND APPOINTMENTS:  He will follow up with Dr. Juanda Chance two  weeks following discharge from [facility].   DISCHARGE MEDICATIONS:  1.  Aspirin 81 mg daily.  2.  Plavix 75 mg daily.  3.  Zocor 20 mg nightly.  4.  Mag-Ox 400 mg b.i.d.  5.  Senna 2 tablets nightly.  6.  Trusopt 2% ophthalmic 1 drop OU b.i.d.  7.  Travatan 0.004% ophthalmic 1 drop OU nightly.  8.  Lasix 20 mg daily.  9.  Altace 5 mg daily.  10. Amiodarone 400 mg b.i.d.  11. Avelox  400 mg daily.  12. Lopressor 25 mg b.i.d.  13. Plendil 10 mg daily.   OUTSTANDING LABORATORY STUDIES:  None.   Duration of discharge encounter 50 minutes including physician time.      Ok Anis, NP    ______________________________  Charlies Constable, M.D. [facility]    CRB/MEDQ  D:  06/03/2005  T:  06/03/2005  Job:  [id number]

## 2010-12-05 NOTE — Assessment & Plan Note (Signed)
[facility]                              CARDIOLOGY OFFICE NOTE   NAME:Reisz, DANY WALTHER                       MRN:          [id number]  DATE:03/08/2006                            DOB:          February 19, 1927    PRIMARY CARE PHYSICIAN:  Donia Guiles, M.D.   CLINICAL HISTORY:  .  Olin Gurski is 77 years old and was hospitalized earlier this year with  congestive heart failure and atrial fibrillation.  He underwent  cardioversion, but subsequently suffered an embolic stroke, __________  some  difficulty with his gait.  This is mostly resolved, but he still has some  visual symptoms and still walks with a cane.  We deferred evaluation of his  new congestive heart failure due to his stroke.  He has done fairly well  since that time.  He says he has had no chest pain and no shortness of  breath.  He has had some intermittent edema, but Dr. Arvilla Market stopped his  calcium channel blocker and this helped.   PAST MEDICAL HISTORY:  Significant for hypertension, hyperlipidemia and  venous insufficiency of the lower extremities.   CURRENT MEDICATIONS:  Plavix, Zocor, amiodarone, Coumadin, magnesium, Lasix,  metoprolol, and Altace.   PHYSICAL EXAMINATION:  VITAL SIGNS:  Blood pressure 118/88.  Pulse 54 and  regular.  NECK:  There was no venous distention.  The carotid pulses were full without  bruits.  CHEST:  Clear.  CARDIAC:  Rhythm is regular.  No murmurs or gallops.  ABDOMEN:  Protuberant, but there was no hepatosplenomegaly and there were  normal bowel sounds.  EXTREMITIES:  There were some stasis changes in the lower extremities with  only trace edema and the pedal pulses were equal.   An electrocardiogram showed sinus rhythm with an interventricular conduction  delay of the left bundle branch block-type pattern.  There were also  occasional PVCs.  The conduction delay was new from his previous tracing.   IMPRESSION:  1. Recent congestive heart  failure with normal systolic function, now      compensated.  2. Paroxysmal atrial fibrillation status post cardioversion now controlled      on amiodarone.  3. Embolic stroke following cardioversion.  4. Coronary artery disease, status post prior __________  wall infarction      and multiple percutaneous coronary interventions.  5. Hyperlipidemia.  6. Hypertension.  7. Venous insufficiency of the lower extremities.   RECOMMENDATIONS:  Pauline's EKG has changed today and he never was fully  evaluated for his heart failure because of his stroke.  For these reasons, I  would like to do a 2-D echo and an adenosine rest stress Myoview scan.  He  just had recent laboratory work by Dr. Arvilla Market and his lipid profile was  quite good.  He asked about getting a screening profile, which included  carotid Dopplers, abdominal aneurysm screening, peripheral vascular Doppler  studies and a bone density study.  I told him that would be reasonable to do  these things considering the price.  He did have recent carotid and I told  him  that was not necessary and thought the bone density study was probably  not too important, but the other studies might be helpful.  We will be in  touch with him after we have the results of his echo and Myoview scan.  If  these are negative, we will see him back in six months.                               Bruce Elvera Lennox Juanda Chance, MD, [facility]    BRB/MedQ  DD:  03/18/2006  DT:  03/19/2006  Job #:  [id number]

## 2010-12-05 NOTE — Discharge Summary (Signed)
[name], [name]                ACCOUNT NO.:  [id number]   MEDICAL RECORD NO.:  [id number]          PATIENT TYPE:  INP   LOCATION:  4741                         FACILITY:  [facility]   PHYSICIAN:  Olga Millers, M.D. LHCDATE OF BIRTH:  [birth year]   DATE OF ADMISSION:  05/25/2005  DATE OF DISCHARGE:                                 DISCHARGE SUMMARY   ADDENDUM:   HOSPITAL COURSE:  Plan throughout hospitalization was to perform cardiac  catheterization. However, this has been placed on hold secondary to  comorbidities and stroke. Plan going forward would be for outpatient  functional study following discharge from [facility].      Ok Anis, NP    ______________________________  Olga Millers, M.D. [facility]    CRB/MEDQ  D:  06/03/2005  T:  06/03/2005  Job:  [id number]

## 2010-12-05 NOTE — Op Note (Signed)
[name], [name]                ACCOUNT NO.:  [id number]   MEDICAL RECORD NO.:  [id number]          PATIENT TYPE:  INP   LOCATION:  4741                         FACILITY:  [facility]   PHYSICIAN:  Arvilla Meres, M.D. LHCDATE OF BIRTH:  [birth year]   DATE OF PROCEDURE:  05/27/2005  DATE OF DISCHARGE:                                 OPERATIVE REPORT   PROCEDURE PERFORMED:  Direct current cardioversion.   PATIENT IDENTIFICATION:  [name] is a very pleasant 77 year old male with  a history of hypertension and coronary artery disease who is admitted to the  hospital yesterday with a three-day history of increasing shortness of  breath.  On admission he was in normal sinus rhythm. However, overnight, he  went into atrial fibrillation with rapid ventricular response with heart  rates into the 170s.  His rate and rhythm have been unresponsive to multiple  drugs including IV Lopressor, Cardizem and IV digoxin.  He was started on IV  amiodarone but still remained quite tachycardic.  Thus urgent cardioversion  was requested.  I had a long talk with the patient and his family regarding  the risks of cardioversion with particular attention to the risks of  thromboembolism.  As he was in normal sinus rhythm less than 48 hours ago, I  explained to him that the risk was probably low, however, I could not  guarantee to him that he had not been in atrial fibrillation over the past  few days.  However, given that he was in sinus rhythm on admission, we felt  that the procedure was likely low risk.   SEDATION:  As patient had eaten some peaches a few hours prior to the  cardioversion, he was brought down to the PACU by anesthesia and sedated  with etomidate and succinylcholine.  He was then intubated for airway  protection.  Once general anesthesia was achieved with etomidate and  succinylcholine, then a single synchronized 200 joule biphasic shock was  delivered with prompt return to normal sinus  rhythm.  There were no apparent  complications.  EKG is pending.  He will be recovered in the PACU and then  returned to his room.      Arvilla Meres, M.D. [facility]  Electronically Signed     DB/MEDQ  D:  05/27/2005  T:  05/28/2005  Job:  [id number]

## 2010-12-05 NOTE — Discharge Summary (Signed)
[place]  Patient:    [name], [name] Visit Number: [id number] MRN: [id number]          Service Type: MED Location: [id number] Attending Physician:  Nathen May Dictated by:   Rozell Searing, P.A. Admit Date:  07/30/2001 Disc. Date: 08/02/01                    Referring Physician Discharge [place]ma  PROCEDURES:  Coronary angiogram/DCA and PTCA of LAD - August 01, 2001.  REASON FOR ADMISSION:  [name] is a 77 year old male with known coronary artery disease status post prior myocardial infarction and multiple prior percutaneous interventions, followed by Dr. Ephraim Hamburger who presented with symptoms suggestive of crescendo angina pectoris.  Initial cardiac enzymes and EKG showed no acute changes.  The patient was admitted for management of acute coronary syndrome.  LABORATORY DATA:  Cardiac enzymes:  CPK/MB negative x 2; troponin I 0.01 (x 2).  Lipid profile:  Total cholesterol 133, triglycerides 28, HDL undetectable, LDL 127.  Sodium 138, potassium 3.4, glucose 85, BUN 5, creatinine 1.0 at discharge.  Hemoglobin 13.7 at discharge.  HOSPITAL COURSE:  Patient was admitted for management of acute coronary syndrome and was placed on intravenous heparin, Integrilin, and nitroglycerin upon admission.  Serial cardiac enzymes were negative for myocardial infarction.  The patient had no further chest discomfort.  Coronary angiogram, performed by Dr. Ladona Ridgel (see catheterization report for full details) notable for severe single-vessel coronary artery disease and preserved LV function.  Specifically, there was subtotal occlusion of the proximal LAD; 60-70% DX-1; 80% DX-1 branch; 20-30% multiple circumflex lesions; nonobstructive RCA.  Patient underwent subsequent successful percutaneous intervention by Dr. Juanda Chance with directional coronary atheterectomy/PTCA with atherectomy/PTCA dilatation of the 95% LAD lesion to 10% residual stenosis - TIMI 2  flow was improved to TIMI 3 by the end of procedure.  Of note, Dr. Juanda Chance did not stent the lesion due to the side branch.  Patient was cleared for discharge the following morning in hemodynamically-stable condition.  Dr. Juanda Chance recommended Plavix x 1 month secondary to presentation of acute coronary syndrome.  DISCHARGE MEDICATIONS: 1. Plavix 75 mg q.d. (x 4 weeks). 2. Coated aspirin 81 mg q.d. 3. Zocor 20 mg q.d. 4. Lopressor 50 mg b.i.d. 5. Plendil 10 mg q.d. 6. Altace 10 mg q.d. 7. Nitrostat as directed.  INSTRUCTIONS:  ACTIVITY:  No heavy lifting, driving, or strenuous activity x 2 days.  DIET:  Low fat/cholesterol diet.  WOUND CARE:  Call the office if there is any bleeding/swelling of the groin.  FOLLOW-UP:  Patient is scheduled to follow up with Dr. Charlies Constable on Wednesday, August 24, 2001 at 12 p.m.  DISCHARGE DIAGNOSES: 1. Acute coronary syndrome.    a. Negative serial cardiac enzymes.    b. Severe single-vessel coronary artery disease/preserved left ventricular       function - directional coronary atherectomy/percutaneous transluminal       coronary angioplasty of 95% left anterior descending artery on August 01, 2001.    c. Status post non-Q-wave myocardial infarction/percutaneous transluminal       coronary angioplasty of right coronary artery in 1994.    d. Status post prior multiple percutaneous transluminal coronary       angioplasties of left anterior descending artery in the 1980s. 2. Dislipidemia. 3. Hypertension. 4. Hypokalemia. 5. Prostate cancer.    Status post transurethral resection of the prostate. Dictated by:  Gene Serpe, P.A. Attending Physician:  Nathen May DD:  08/02/01 TD:  08/02/01 Job: [id number]

## 2010-12-05 NOTE — Assessment & Plan Note (Signed)
[facility]                            CARDIOLOGY OFFICE NOTE   NAME:Zukowski, ASHKAN CHAMBERLAND                       MRN:          [id number]  DATE:06/22/2006                            DOB:          10-26-1926    PRIMARY CARE PHYSICIAN:  Donia Guiles, M.D.   HISTORY:  Bryann Mcnealy is 77 years old, has coronary heart disease and a  previous __________ and also __________ .  He was hospitalized last year  with CHF, had embolic stroke, __________.  He was evaluated with Myoview  stress test but no ischemia __________.  His heart failure is well  controlled.  He remained in sinus rhythm.   Jacolyn Reedy saw him recently to help to adjust his blood pressure  medicines __________.   PAST MEDICAL HISTORY:  Significant for venous insufficiency of the lower  extremities, hyperlipidemia and hypertension.   CURRENT MEDICATIONS:  Include Zocor, aspirin, amiodarone, Coumadin,  Lasix, metoprolol, Altace.   PHYSICAL EXAMINATION:  Blood pressure is __________ , the pulse is 59  and regular.  NECK:  There was no venous distention.  The carotid pulses were full  without bruits.  CHEST:  Clear.  CARDIAC:  Rhythm was regular.  I could hear no murmurs or gallops.  ABDOMEN:  Protuberant.  Bowel sounds were normal.  The abdomen was soft.  There was no hepatosplenomegaly.  There was trace peripheral edema and the pedal pulses were equal.   IMPRESSION:  1. Coronary artery disease, status post prior diaphragmatic wall      infarction and multiple percutaneous coronary intervention.  2. Ischemic cardiomyopathy with an ejection fraction of 37% by      Cardiolite, and 55% to 60% by echocardiogram.  3. Congestive heart failure related to systolic dysfunction and now      compensated.  4. Atrial fibrillation, now controlled on amiodarone and Coumadin.  5. History of embolic stroke following cardioversion for atrial      fibrillation in 2006 with residual gait disturbance.  6.  Hyperlipidemia.  7. Venous insufficiency of the lower extremities.  8. Hypertension.   RECOMMENDATIONS:  I think Cong is doing well from a cardiac  standpoint.  His blood pressure is up some now that he has cut back on  his medicines, so we will give him a prescription for the higher dose of  Altace 10 mg daily, and the higher dose of Lasix 40 mg daily.  We will have him come back in about a week for a BMP, BNP, and a repeat  blood pressure check.  If all these are okay, I will see him back in  followup in 6 months.     Bruce Elvera Lennox Juanda Chance, MD, [facility]  Electronically Signed    BRB/MedQ  DD: 06/22/2006  DT: 06/23/2006  Job #: [id number]

## 2010-12-09 ENCOUNTER — Ambulatory Visit (INDEPENDENT_AMBULATORY_CARE_PROVIDER_SITE_OTHER): Payer: Medicare Other | Admitting: *Deleted

## 2010-12-09 DIAGNOSIS — I6350 Cerebral infarction due to unspecified occlusion or stenosis of unspecified cerebral artery: Secondary | ICD-10-CM

## 2010-12-09 DIAGNOSIS — Z7901 Long term (current) use of anticoagulants: Secondary | ICD-10-CM

## 2010-12-09 DIAGNOSIS — I4891 Unspecified atrial fibrillation: Secondary | ICD-10-CM

## 2010-12-09 LAB — POCT INR: INR: 2.2

## 2011-01-06 ENCOUNTER — Other Ambulatory Visit: Payer: Self-pay | Admitting: *Deleted

## 2011-01-06 ENCOUNTER — Ambulatory Visit (INDEPENDENT_AMBULATORY_CARE_PROVIDER_SITE_OTHER): Payer: Medicare Other | Admitting: *Deleted

## 2011-01-06 DIAGNOSIS — I4891 Unspecified atrial fibrillation: Secondary | ICD-10-CM

## 2011-01-06 DIAGNOSIS — Z7901 Long term (current) use of anticoagulants: Secondary | ICD-10-CM

## 2011-01-06 DIAGNOSIS — I6350 Cerebral infarction due to unspecified occlusion or stenosis of unspecified cerebral artery: Secondary | ICD-10-CM

## 2011-01-06 LAB — POCT INR: INR: 2.5

## 2011-01-06 MED ORDER — RAMIPRIL 10 MG PO TABS: 10.0000 mg | ORAL_TABLET | Freq: Every day | ORAL | Status: DC

## 2011-01-07 ENCOUNTER — Telehealth: Payer: Self-pay | Admitting: Cardiovascular Disease

## 2011-01-07 NOTE — Telephone Encounter (Signed)
Pt called and was in yesterday for PT.  His dose has changed and he wants to make sure before taking.

## 2011-01-07 NOTE — Patient Instructions (Signed)
01/07/11: Pt calls states he doesn't take the amount that is listed in the table. He states that he only take 1.5 pills daily except 1 pill on Sundays, Tuesdays and Thursdays. He states that has been his dose for some time.

## 2011-02-03 ENCOUNTER — Ambulatory Visit (INDEPENDENT_AMBULATORY_CARE_PROVIDER_SITE_OTHER): Payer: Medicare Other | Admitting: *Deleted

## 2011-02-03 DIAGNOSIS — I4891 Unspecified atrial fibrillation: Secondary | ICD-10-CM

## 2011-02-03 DIAGNOSIS — I6350 Cerebral infarction due to unspecified occlusion or stenosis of unspecified cerebral artery: Secondary | ICD-10-CM

## 2011-02-03 DIAGNOSIS — Z7901 Long term (current) use of anticoagulants: Secondary | ICD-10-CM

## 2011-02-09 ENCOUNTER — Encounter: Payer: Self-pay | Admitting: Cardiovascular Disease

## 2011-02-11 ENCOUNTER — Encounter: Payer: Self-pay | Admitting: Cardiovascular Disease

## 2011-02-11 ENCOUNTER — Ambulatory Visit (INDEPENDENT_AMBULATORY_CARE_PROVIDER_SITE_OTHER): Payer: Medicare Other | Admitting: Cardiovascular Disease

## 2011-02-11 VITALS — BP 138/87 | HR 60 | Resp 14 | Ht 67.0 in | Wt 182.0 lb

## 2011-02-11 DIAGNOSIS — I4891 Unspecified atrial fibrillation: Secondary | ICD-10-CM

## 2011-02-11 DIAGNOSIS — I2510 Atherosclerotic heart disease of native coronary artery without angina pectoris: Secondary | ICD-10-CM

## 2011-02-11 MED ORDER — HYDRALAZINE HCL 50 MG PO TABS: 50.0000 mg | ORAL_TABLET | Freq: Three times a day (TID) | ORAL | Status: DC

## 2011-02-11 NOTE — Progress Notes (Signed)
Addended by: Ellender Hose on: 02/11/2011 04:57 PM   Modules accepted: Orders

## 2011-02-11 NOTE — Assessment & Plan Note (Addendum)
Stable. No changes. Continue current therapy.  

## 2011-02-11 NOTE — Progress Notes (Signed)
History of Present Illness:77 yo with h/o CAD, paroxysmal atrial fibrillation, CVA, diastolic CHF, HTN, HLD, CRI here today for cardiac follow up. He has been followed in the past by Dr. Juanda Chance. He has had multiple PCI. He  also has paroxysmal atrial fibrillation which is treated with amiodarone and Coumadin. He had a previous stroke with left-sided weakness a number of years ago. He walks with a cane. He also has a history of diastolic CHF and his last ejection fraction was 50%.  He tells me today that his is doing well. He has had no chest pain. His breathing has been ok. No dizziness, near syncope, syncope or palpitations. He is tolerating his medications. He is on coumadin. No bleeding.   His primary care doctor is Dr. Renaye Rakers.   Past Medical History  Diagnosis Date  . Hx of adenomatous colonic polyps     hx?  . Diverticulosis of colon   . MI (myocardial infarction)   . Cellulitis     arm  . BPH (benign prostatic hypertrophy)     hx of; s/p TURP  . Venous insufficiency of leg     lower extremity edema; probably an element of diastolic heart failure and this was previously aggrevated by Norvasc  . HLD (hyperlipidemia)   . HTN (hypertension)   . CVA (cerebral vascular accident)     hx  . Atrial fibrillation     embolic stroke secondary to a-fi; paroxysmal a-fib, controlled on amiodarone and coumadin    . CHF (congestive heart failure)   . Ischemic cardiomyopathy   . CAD (coronary artery disease)     s/p remote diaphragmatic wall infarction and multiple percutaneous coronary interventions. EF 50%  . Chronic renal insufficiency     creatinine 1.9     Past Surgical History  Procedure Date  . Turp 1989  . Angioplasty/stent placement     Current Outpatient Prescriptions  Medication Sig Dispense Refill  . amiodarone (PACERONE) 200 MG tablet Take 100 mg by mouth daily.        Marland Kitchen aspirin 81 MG EC tablet Take 81 mg by mouth daily.        . furosemide (LASIX) 40 MG tablet Take 40  mg by mouth daily.        . hydrALAZINE (APRESOLINE) 50 MG tablet Take 50 mg by mouth 3 (three) times daily.        Marland Kitchen loratadine (ALLERGY RELIEF) 10 MG dissolvable tablet Take 10 mg by mouth daily.       . magnesium oxide (MAG-OX) 400 MG tablet Take 400 mg by mouth 2 (two) times daily.        . metoprolol (TOPROL-XL) 50 MG 24 hr tablet Take 50 mg by mouth 2 (two) times daily.        . pravastatin (PRAVACHOL) 20 MG tablet Take 20 mg by mouth at bedtime.        . ramipril (ALTACE) 10 MG tablet Take 1 tablet (10 mg total) by mouth daily.  30 tablet  2  . warfarin (COUMADIN) 2 MG tablet Take as directed by Anticoagulation clinic   135 tablet  1    No Known Allergies  History   Social History  . Marital Status: Widowed    Spouse Name: N/A    Number of Children: N/A  . Years of Education: N/A   Occupational History  . Not on file.   Social History Main Topics  . Smoking status: Former Games developer  . Smokeless  tobacco: Not on file   Comment: nonsmoker  . Alcohol Use: No  . Drug Use: Not on file  . Sexually Active: Not on file   Other Topics Concern  . Not on file   Social History Narrative   Worked as a Midwife.     Family History  Problem Relation Age of Onset  . Coronary artery disease      family hx     Review of Systems:  As stated in the HPI and otherwise negative.   BP 138/87  Pulse 60  Resp 14  Ht 5\' 7"  (1.702 m)  Wt 182 lb (82.555 kg)  BMI 28.51 kg/m2  Physical Examination: General: Well developed, well nourished, NAD HEENT: OP clear, mucus membranes moist SKIN: warm, dry. No rashes. Neuro: No focal deficits Musculoskeletal: Muscle strength 5/5 all ext Psychiatric: Mood and affect normal Neck: No JVD, no carotid bruits, no thyromegaly, no lymphadenopathy. Lungs:Clear bilaterally, no wheezes, rhonci, crackles Cardiovascular: Regular rate and rhythm. No murmurs, gallops or rubs. Abdomen:Soft. Bowel sounds present. Non-tender.  Extremities: No lower  extremity edema. Pulses are 2 + in the bilateral DP/PT.  EKG:NSR, rate 60 bpm. LAD. LVH.

## 2011-02-11 NOTE — Assessment & Plan Note (Signed)
NSR today. He is on amiodarone and coumadin. No changes.

## 2011-02-11 NOTE — Patient Instructions (Signed)
Your physician recommends that you schedule a follow-up appointment in: 6 months  

## 2011-03-03 ENCOUNTER — Other Ambulatory Visit: Payer: Self-pay | Admitting: *Deleted

## 2011-03-03 ENCOUNTER — Ambulatory Visit (INDEPENDENT_AMBULATORY_CARE_PROVIDER_SITE_OTHER): Payer: Medicare Other | Admitting: *Deleted

## 2011-03-03 DIAGNOSIS — Z7901 Long term (current) use of anticoagulants: Secondary | ICD-10-CM

## 2011-03-03 DIAGNOSIS — I2510 Atherosclerotic heart disease of native coronary artery without angina pectoris: Secondary | ICD-10-CM

## 2011-03-03 DIAGNOSIS — I6350 Cerebral infarction due to unspecified occlusion or stenosis of unspecified cerebral artery: Secondary | ICD-10-CM

## 2011-03-03 DIAGNOSIS — I4891 Unspecified atrial fibrillation: Secondary | ICD-10-CM

## 2011-03-03 MED ORDER — HYDRALAZINE HCL 50 MG PO TABS: 50.0000 mg | ORAL_TABLET | Freq: Three times a day (TID) | ORAL | Status: DC

## 2011-03-24 ENCOUNTER — Other Ambulatory Visit: Payer: Self-pay | Admitting: *Deleted

## 2011-03-24 DIAGNOSIS — I4891 Unspecified atrial fibrillation: Secondary | ICD-10-CM

## 2011-03-24 DIAGNOSIS — I10 Essential (primary) hypertension: Secondary | ICD-10-CM

## 2011-03-24 MED ORDER — METOPROLOL SUCCINATE ER 50 MG PO TB24: 50.0000 mg | ORAL_TABLET | Freq: Two times a day (BID) | ORAL | Status: DC

## 2011-03-24 NOTE — Telephone Encounter (Signed)
Walk in patient requesting a medication refill for Toprol XL 50 mg  twice a day. A prescription for 90 days supply and 3 refill send to [facility]. A 2 weeks supply called to [facility] as requested. Patient aware.

## 2011-03-31 ENCOUNTER — Ambulatory Visit (INDEPENDENT_AMBULATORY_CARE_PROVIDER_SITE_OTHER): Payer: Medicare Other | Admitting: *Deleted

## 2011-03-31 DIAGNOSIS — Z7901 Long term (current) use of anticoagulants: Secondary | ICD-10-CM

## 2011-03-31 DIAGNOSIS — I4891 Unspecified atrial fibrillation: Secondary | ICD-10-CM

## 2011-03-31 DIAGNOSIS — I6350 Cerebral infarction due to unspecified occlusion or stenosis of unspecified cerebral artery: Secondary | ICD-10-CM

## 2011-04-28 ENCOUNTER — Ambulatory Visit (INDEPENDENT_AMBULATORY_CARE_PROVIDER_SITE_OTHER): Payer: Medicare Other | Admitting: *Deleted

## 2011-04-28 DIAGNOSIS — I6350 Cerebral infarction due to unspecified occlusion or stenosis of unspecified cerebral artery: Secondary | ICD-10-CM

## 2011-04-28 DIAGNOSIS — Z7901 Long term (current) use of anticoagulants: Secondary | ICD-10-CM

## 2011-04-28 DIAGNOSIS — I4891 Unspecified atrial fibrillation: Secondary | ICD-10-CM

## 2011-05-12 ENCOUNTER — Other Ambulatory Visit: Payer: Self-pay | Admitting: Pharmacist

## 2011-05-12 ENCOUNTER — Ambulatory Visit (INDEPENDENT_AMBULATORY_CARE_PROVIDER_SITE_OTHER): Payer: Medicare Other | Admitting: *Deleted

## 2011-05-12 DIAGNOSIS — I6350 Cerebral infarction due to unspecified occlusion or stenosis of unspecified cerebral artery: Secondary | ICD-10-CM

## 2011-05-12 DIAGNOSIS — Z7901 Long term (current) use of anticoagulants: Secondary | ICD-10-CM

## 2011-05-12 DIAGNOSIS — I4891 Unspecified atrial fibrillation: Secondary | ICD-10-CM

## 2011-05-12 MED ORDER — WARFARIN SODIUM 2 MG PO TABS: ORAL_TABLET | ORAL | Status: DC

## 2011-05-13 ENCOUNTER — Other Ambulatory Visit: Payer: Self-pay | Admitting: Pharmacist

## 2011-05-13 MED ORDER — WARFARIN SODIUM 2 MG PO TABS: ORAL_TABLET | ORAL | Status: DC

## 2011-06-02 ENCOUNTER — Ambulatory Visit (INDEPENDENT_AMBULATORY_CARE_PROVIDER_SITE_OTHER): Payer: Medicare Other | Admitting: *Deleted

## 2011-06-02 DIAGNOSIS — Z7901 Long term (current) use of anticoagulants: Secondary | ICD-10-CM

## 2011-06-02 DIAGNOSIS — I4891 Unspecified atrial fibrillation: Secondary | ICD-10-CM

## 2011-06-02 DIAGNOSIS — I6350 Cerebral infarction due to unspecified occlusion or stenosis of unspecified cerebral artery: Secondary | ICD-10-CM

## 2011-06-04 ENCOUNTER — Telehealth: Payer: Self-pay | Admitting: Cardiovascular Disease

## 2011-06-04 NOTE — Telephone Encounter (Signed)
Pt needs refill pravastatin 40mg , uses [facility] 90 day supply

## 2011-06-05 ENCOUNTER — Other Ambulatory Visit: Payer: Self-pay

## 2011-06-05 MED ORDER — PRAVASTATIN SODIUM 20 MG PO TABS: 20.0000 mg | ORAL_TABLET | Freq: Every day | ORAL | Status: DC

## 2011-06-30 ENCOUNTER — Ambulatory Visit (INDEPENDENT_AMBULATORY_CARE_PROVIDER_SITE_OTHER): Payer: Medicare Other | Admitting: *Deleted

## 2011-06-30 DIAGNOSIS — I6350 Cerebral infarction due to unspecified occlusion or stenosis of unspecified cerebral artery: Secondary | ICD-10-CM

## 2011-06-30 DIAGNOSIS — Z7901 Long term (current) use of anticoagulants: Secondary | ICD-10-CM

## 2011-06-30 DIAGNOSIS — I4891 Unspecified atrial fibrillation: Secondary | ICD-10-CM

## 2011-06-30 LAB — POCT INR: INR: 2

## 2011-07-28 ENCOUNTER — Ambulatory Visit (INDEPENDENT_AMBULATORY_CARE_PROVIDER_SITE_OTHER): Payer: Medicare Other | Admitting: *Deleted

## 2011-07-28 DIAGNOSIS — Z7901 Long term (current) use of anticoagulants: Secondary | ICD-10-CM

## 2011-07-28 DIAGNOSIS — I6350 Cerebral infarction due to unspecified occlusion or stenosis of unspecified cerebral artery: Secondary | ICD-10-CM

## 2011-07-28 DIAGNOSIS — I4891 Unspecified atrial fibrillation: Secondary | ICD-10-CM

## 2011-07-28 LAB — POCT INR: INR: 1.7

## 2011-07-28 NOTE — Patient Instructions (Signed)
Risk of low INR with anticoagulant therapy discussed with patient, coming for 4 week visit per patient request.

## 2011-07-29 ENCOUNTER — Other Ambulatory Visit: Payer: Self-pay | Admitting: *Deleted

## 2011-07-29 MED ORDER — AMIODARONE HCL 200 MG PO TABS: 100.0000 mg | ORAL_TABLET | Freq: Every day | ORAL | Status: DC

## 2011-07-29 MED ORDER — FUROSEMIDE 40 MG PO TABS: 40.0000 mg | ORAL_TABLET | Freq: Every day | ORAL | Status: DC

## 2011-08-27 ENCOUNTER — Encounter: Payer: Self-pay | Admitting: Cardiovascular Disease

## 2011-08-27 ENCOUNTER — Ambulatory Visit (INDEPENDENT_AMBULATORY_CARE_PROVIDER_SITE_OTHER): Payer: Medicare Other | Admitting: *Deleted

## 2011-08-27 ENCOUNTER — Ambulatory Visit (INDEPENDENT_AMBULATORY_CARE_PROVIDER_SITE_OTHER): Payer: Medicare Other | Admitting: Cardiovascular Disease

## 2011-08-27 VITALS — BP 130/70 | HR 63 | Ht 67.0 in | Wt 184.0 lb

## 2011-08-27 DIAGNOSIS — Z7901 Long term (current) use of anticoagulants: Secondary | ICD-10-CM

## 2011-08-27 DIAGNOSIS — I6350 Cerebral infarction due to unspecified occlusion or stenosis of unspecified cerebral artery: Secondary | ICD-10-CM

## 2011-08-27 DIAGNOSIS — I10 Essential (primary) hypertension: Secondary | ICD-10-CM

## 2011-08-27 DIAGNOSIS — I2510 Atherosclerotic heart disease of native coronary artery without angina pectoris: Secondary | ICD-10-CM

## 2011-08-27 DIAGNOSIS — Z8679 Personal history of other diseases of the circulatory system: Secondary | ICD-10-CM

## 2011-08-27 DIAGNOSIS — I4891 Unspecified atrial fibrillation: Secondary | ICD-10-CM

## 2011-08-27 LAB — POCT INR: INR: 3.3

## 2011-08-27 MED ORDER — VALSARTAN 160 MG PO TABS: 160.0000 mg | ORAL_TABLET | Freq: Every day | ORAL | Status: DC

## 2011-08-27 NOTE — Patient Instructions (Signed)
Your physician wants you to follow-up in: 6 months.  You will receive a reminder letter in the mail two months in advance. If you don't receive a letter, please call our office to schedule the follow-up appointment.  Your physician has recommended you make the following change in your medication: Stop ramipril. Start Diovan 160 mg by mouth daily.   Your physician recommends that you return for fasting lab work in: next week or so--Lipid and Liver profile

## 2011-08-27 NOTE — Assessment & Plan Note (Signed)
BP is well controlled. No changes.  

## 2011-08-27 NOTE — Assessment & Plan Note (Signed)
Stable. Will change Ramipril to Diovan secondary to cough. Continue other meds. Will check lipids and LFTs in one week.

## 2011-08-27 NOTE — Progress Notes (Signed)
History of Present Illness: 78 yo with h/o [name], [name], [name], [name], [name], [name], [name]. He had a previous stroke with left-sided weakness a number of years ago. He walks with a cane. He also has a history of [name] and his last ejection fraction was 50%.  I last saw him in July 2012.  He tells me today that his is doing well. He has had no chest pain. His breathing has been ok. No dizziness, near syncope, syncope or palpitations. He is tolerating his medications. He is on [name]. No bleeding. He has been coughing in the evening. This has been occurring for two months. Non-productive. No fevers, chills, rigors.   His primary care doctor is Dr. Renaye Rakers.    Past Medical History  Diagnosis Date  . Hx of adenomatous colonic polyps     hx?  . Diverticulosis of colon   . MI (myocardial infarction)   . Cellulitis     arm  . BPH (benign prostatic hypertrophy)     hx of; s/p TURP  . Venous insufficiency of leg     lower extremity edema; probably an element of [name] heart failure and this was previously aggrevated by Norvasc  . [name] (hyperlipidemia)   . [name] (hypertension)   . [name] (cerebral vascular accident)     hx  . Atrial [name]     embolic stroke secondary to a-fi; [name] a-fib, controlled on amiodarone and [name]    . [name] (congestive heart failure)   . Ischemic cardiomyopathy   . [name] (coronary artery disease)     s/p remote diaphragmatic wall infarction and multiple percutaneous coronary interventions. EF 50%  . Chronic renal insufficiency     creatinine 1.9     Past Surgical History  Procedure Date  . Turp 1989  . Angioplasty/stent placement     Current Outpatient Prescriptions  Medication Sig Dispense Refill  . amiodarone  (PACERONE) 200 MG tablet Take 0.5 tablets (100 mg total) by mouth daily.  45 tablet  3  . aspirin 81 MG EC tablet Take 81 mg by mouth daily.        . furosemide (LASIX) 40 MG tablet Take 1 tablet (40 mg total) by mouth daily.  90 tablet  3  . hydrALAZINE (APRESOLINE) 50 MG tablet Take 1 tablet (50 mg total) by mouth 3 (three) times daily.  270 tablet  3  . loratadine (ALLERGY RELIEF) 10 MG dissolvable tablet Take 10 mg by mouth daily.       . magnesium oxide (MAG-OX) 400 MG tablet Take 400 mg by mouth 2 (two) times daily.        . metoprolol (TOPROL-XL) 50 MG 24 hr tablet Take 1 tablet (50 mg total) by mouth 2 (two) times daily.  180 tablet  3  . pravastatin (PRAVACHOL) 20 MG tablet Take 1 tablet (20 mg total) by mouth at bedtime.  90 tablet  3  . ramipril (ALTACE) 10 MG capsule daily      . RESTASIS 0.05 % ophthalmic emulsion As directed      . warfarin ([name]) 2 MG tablet Take as directed by Anticoagulation clinic  135 tablet  1    No Known Allergies  History   Social History  . Marital Status: Widowed  Spouse Name: N/A    Number of Children: N/A  . Years of Education: N/A   Occupational History  . Not on file.   Social History Main Topics  . Smoking status: Former Games developer  . Smokeless tobacco: Not on file   Comment: nonsmoker  . Alcohol Use: No  . Drug Use: Not on file  . Sexually Active: Not on file   Other Topics Concern  . Not on file   Social History Narrative   Worked as a Midwife.     Family History  Problem Relation Age of Onset  . Coronary artery disease      family hx     Review of Systems:  As stated in the HPI and otherwise negative.   BP 130/70  Pulse 63  Ht 5\' 7"  (1.702 m)  Wt 184 lb (83.462 kg)  BMI 28.82 kg/m2  Physical Examination: General: Well developed, well nourished, NAD HEENT: OP clear, mucus membranes moist SKIN: warm, dry. No rashes. Neuro: No focal deficits Musculoskeletal: Muscle strength 5/5 all ext Psychiatric: Mood  and affect normal Neck: No JVD, no carotid bruits, no thyromegaly, no lymphadenopathy. Lungs:Clear bilaterally, no wheezes, rhonci, crackles Cardiovascular: Regular rate and rhythm. No murmurs, gallops or rubs. Abdomen:Soft. Bowel sounds present. Non-tender.  Extremities: No lower extremity edema. Pulses are 2 + in the bilateral DP/PT.  EKG: NSR, rate 63 bpm. LAD, IVCD. Old inferior infarct. Non-specific T wave changes.

## 2011-08-27 NOTE — Assessment & Plan Note (Signed)
Maintaining NSR. He is on amiodarone. He is also on coumadin therapy. Will need to update eye exams/TSH/CXR at next visit while on amiodarone.

## 2011-08-28 ENCOUNTER — Telehealth: Payer: Self-pay | Admitting: *Deleted

## 2011-08-28 NOTE — Telephone Encounter (Signed)
Spoke with pt and told him paperwork has been completed by Dr. Clifton James and is ready to be picked up in office.  He is aware to ask for medical records when he comes to pick up.

## 2011-08-31 ENCOUNTER — Other Ambulatory Visit (INDEPENDENT_AMBULATORY_CARE_PROVIDER_SITE_OTHER): Payer: Medicare Other | Admitting: *Deleted

## 2011-08-31 DIAGNOSIS — I2510 Atherosclerotic heart disease of native coronary artery without angina pectoris: Secondary | ICD-10-CM

## 2011-08-31 LAB — HEPATIC FUNCTION PANEL
ALT: 26 U/L (ref 0–53)
AST: 36 U/L (ref 0–37)
Albumin: 3.6 g/dL (ref 3.5–5.2)
Alkaline Phosphatase: 56 U/L (ref 39–117)
Bilirubin, Direct: 0.2 mg/dL (ref 0.0–0.3)
Total Bilirubin: 0.8 mg/dL (ref 0.3–1.2)
Total Protein: 7.4 g/dL (ref 6.0–8.3)

## 2011-08-31 LAB — LIPID PANEL
HDL: 61.9 mg/dL (ref >39.00)
Total CHOL/HDL Ratio: 2
VLDL: 10.6 mg/dL (ref 0.0–40.0)

## 2011-09-01 ENCOUNTER — Other Ambulatory Visit: Payer: Medicare Other | Admitting: *Deleted

## 2011-09-10 ENCOUNTER — Ambulatory Visit (INDEPENDENT_AMBULATORY_CARE_PROVIDER_SITE_OTHER): Payer: Medicare Other | Admitting: Pharmacist

## 2011-09-10 DIAGNOSIS — I6350 Cerebral infarction due to unspecified occlusion or stenosis of unspecified cerebral artery: Secondary | ICD-10-CM

## 2011-09-10 DIAGNOSIS — Z7901 Long term (current) use of anticoagulants: Secondary | ICD-10-CM

## 2011-09-10 DIAGNOSIS — I4891 Unspecified atrial fibrillation: Secondary | ICD-10-CM

## 2011-09-10 LAB — POCT INR: INR: 3.3

## 2011-09-24 ENCOUNTER — Ambulatory Visit (INDEPENDENT_AMBULATORY_CARE_PROVIDER_SITE_OTHER): Payer: Medicare Other | Admitting: Pharmacist

## 2011-09-24 DIAGNOSIS — I4891 Unspecified atrial fibrillation: Secondary | ICD-10-CM

## 2011-09-24 DIAGNOSIS — I6350 Cerebral infarction due to unspecified occlusion or stenosis of unspecified cerebral artery: Secondary | ICD-10-CM

## 2011-09-24 DIAGNOSIS — Z7901 Long term (current) use of anticoagulants: Secondary | ICD-10-CM

## 2011-09-24 LAB — POCT INR: INR: 2.2

## 2011-10-06 ENCOUNTER — Telehealth: Payer: Self-pay | Admitting: Cardiovascular Disease

## 2011-10-06 NOTE — Telephone Encounter (Signed)
Spoke with pt and told him he should be taking magnesium oxide 400 mg by mouth twice daily.

## 2011-10-06 NOTE — Telephone Encounter (Signed)
Pt wants to know is it ok for him to take 500 mg of mag instead of 400mg 

## 2011-10-15 ENCOUNTER — Ambulatory Visit (INDEPENDENT_AMBULATORY_CARE_PROVIDER_SITE_OTHER): Payer: Medicare Other | Admitting: *Deleted

## 2011-10-15 DIAGNOSIS — I4891 Unspecified atrial fibrillation: Secondary | ICD-10-CM

## 2011-10-15 DIAGNOSIS — I6350 Cerebral infarction due to unspecified occlusion or stenosis of unspecified cerebral artery: Secondary | ICD-10-CM

## 2011-10-15 DIAGNOSIS — Z7901 Long term (current) use of anticoagulants: Secondary | ICD-10-CM

## 2011-11-12 ENCOUNTER — Ambulatory Visit (INDEPENDENT_AMBULATORY_CARE_PROVIDER_SITE_OTHER): Payer: Medicare Other | Admitting: Pharmacist

## 2011-11-12 DIAGNOSIS — Z7901 Long term (current) use of anticoagulants: Secondary | ICD-10-CM

## 2011-11-12 DIAGNOSIS — I6350 Cerebral infarction due to unspecified occlusion or stenosis of unspecified cerebral artery: Secondary | ICD-10-CM

## 2011-11-12 DIAGNOSIS — I4891 Unspecified atrial fibrillation: Secondary | ICD-10-CM

## 2011-11-12 LAB — POCT INR: INR: 2.7

## 2011-11-30 ENCOUNTER — Other Ambulatory Visit: Payer: Self-pay | Admitting: Pharmacist

## 2011-11-30 MED ORDER — WARFARIN SODIUM 2 MG PO TABS: ORAL_TABLET | ORAL | Status: DC

## 2011-12-01 HISTORY — PX: OTHER SURGICAL HISTORY: SHX169

## 2011-12-24 ENCOUNTER — Ambulatory Visit (INDEPENDENT_AMBULATORY_CARE_PROVIDER_SITE_OTHER): Payer: Medicare Other | Admitting: Pharmacist

## 2011-12-24 DIAGNOSIS — Z7901 Long term (current) use of anticoagulants: Secondary | ICD-10-CM

## 2011-12-24 DIAGNOSIS — I6350 Cerebral infarction due to unspecified occlusion or stenosis of unspecified cerebral artery: Secondary | ICD-10-CM

## 2011-12-24 DIAGNOSIS — I4891 Unspecified atrial fibrillation: Secondary | ICD-10-CM

## 2011-12-24 LAB — POCT INR: INR: 2.7

## 2012-02-04 ENCOUNTER — Ambulatory Visit (INDEPENDENT_AMBULATORY_CARE_PROVIDER_SITE_OTHER): Payer: Medicare Other | Admitting: *Deleted

## 2012-02-04 DIAGNOSIS — I6350 Cerebral infarction due to unspecified occlusion or stenosis of unspecified cerebral artery: Secondary | ICD-10-CM

## 2012-02-04 DIAGNOSIS — I4891 Unspecified atrial fibrillation: Secondary | ICD-10-CM

## 2012-02-04 DIAGNOSIS — Z7901 Long term (current) use of anticoagulants: Secondary | ICD-10-CM

## 2012-02-25 ENCOUNTER — Encounter: Payer: Self-pay | Admitting: Cardiovascular Disease

## 2012-02-25 ENCOUNTER — Ambulatory Visit (INDEPENDENT_AMBULATORY_CARE_PROVIDER_SITE_OTHER): Payer: Medicare Other | Admitting: Cardiovascular Disease

## 2012-02-25 VITALS — BP 139/82 | HR 59 | Ht 67.0 in | Wt 194.0 lb

## 2012-02-25 DIAGNOSIS — I2510 Atherosclerotic heart disease of native coronary artery without angina pectoris: Secondary | ICD-10-CM

## 2012-02-25 DIAGNOSIS — I4891 Unspecified atrial fibrillation: Secondary | ICD-10-CM

## 2012-02-25 NOTE — Progress Notes (Signed)
History of Present Illness: 78 yo male with h/o CAD, paroxysmal atrial fibrillation, CVA, diastolic CHF, HTN, HLD, CRI here today for cardiac follow up. He has been followed in the past by Dr. Juanda Chance. He has had multiple PCI. He also has paroxysmal atrial fibrillation which is treated with amiodarone and Coumadin. He had a previous stroke with left-sided weakness a number of years ago. He walks with a cane. He also has a history of diastolic CHF and his last ejection fraction was 50%. I last saw him in February  2013.   He tells me today that his is doing well. He has had no chest pain. His breathing has been ok. No dizziness, near syncope, syncope or palpitations. He is tolerating his medications. He is on coumadin. No bleeding.   His primary care doctor is Dr. Renaye Rakers.   Last Lipid Profile:  Lipid Panel     Component Value Date/Time   CHOL 143 08/31/2011 1111   TRIG 53.0 08/31/2011 1111   HDL 61.90 08/31/2011 1111   CHOLHDL 2 08/31/2011 1111   VLDL 10.6 08/31/2011 1111   LDLCALC 71 08/31/2011 1111      Past Medical History  Diagnosis Date  . Hx of adenomatous colonic polyps     hx?  . Diverticulosis of colon   . MI (myocardial infarction)   . Cellulitis     arm  . BPH (benign prostatic hypertrophy)     hx of; s/p TURP  . Venous insufficiency of leg     lower extremity edema; probably an element of diastolic heart failure and this was previously aggrevated by Norvasc  . HLD (hyperlipidemia)   . HTN (hypertension)   . CVA (cerebral vascular accident)     hx  . Atrial fibrillation     embolic stroke secondary to a-fi; paroxysmal a-fib, controlled on amiodarone and coumadin    . CHF (congestive heart failure)   . Ischemic cardiomyopathy   . CAD (coronary artery disease)     s/p remote diaphragmatic wall infarction and multiple percutaneous coronary interventions. EF 50%  . Chronic renal insufficiency     creatinine 1.9     Past Surgical History  Procedure Date  . Turp  1989  . Angioplasty/stent placement     Current Outpatient Prescriptions  Medication Sig Dispense Refill  . amiodarone (PACERONE) 200 MG tablet Take 0.5 tablets (100 mg total) by mouth daily.  45 tablet  3  . aspirin 81 MG EC tablet Take 81 mg by mouth daily.        . furosemide (LASIX) 40 MG tablet Take 1 tablet (40 mg total) by mouth daily.  90 tablet  3  . hydrALAZINE (APRESOLINE) 50 MG tablet Take 1 tablet (50 mg total) by mouth 3 (three) times daily.  270 tablet  3  . loratadine (ALLERGY RELIEF) 10 MG dissolvable tablet Take 10 mg by mouth daily.       . magnesium oxide (MAG-OX) 400 MG tablet Take 400 mg by mouth 2 (two) times daily.        . metoprolol (TOPROL-XL) 50 MG 24 hr tablet Take 1 tablet (50 mg total) by mouth 2 (two) times daily.  180 tablet  3  . pravastatin (PRAVACHOL) 20 MG tablet Take 1 tablet (20 mg total) by mouth at bedtime.  90 tablet  3  . RESTASIS 0.05 % ophthalmic emulsion As directed      . valsartan (DIOVAN) 160 MG tablet Take 1 tablet (160 mg  total) by mouth daily.  90 tablet  3  . warfarin (COUMADIN) 2 MG tablet Take as directed by Anticoagulation clinic  45 tablet  2    No Known Allergies  History   Social History  . Marital Status: Widowed    Spouse Name: N/A    Number of Children: N/A  . Years of Education: N/A   Occupational History  . Not on file.   Social History Main Topics  . Smoking status: Former Games developer  . Smokeless tobacco: Never Used   Comment: nonsmoker  . Alcohol Use: No  . Drug Use: Not on file  . Sexually Active: Not on file   Other Topics Concern  . Not on file   Social History Narrative   Worked as a Midwife.     Family History  Problem Relation Age of Onset  . Coronary artery disease      family hx     Review of Systems:  As stated in the HPI and otherwise negative.   BP 139/82  Pulse 59  Ht 5\' 7"  (1.702 m)  Wt 194 lb (87.998 kg)  BMI 30.38 kg/m2  Physical Examination: General: Well developed, well  nourished, NAD HEENT: OP clear, mucus membranes moist SKIN: warm, dry. No rashes. Neuro: No focal deficits Musculoskeletal: Muscle strength 5/5 all ext Psychiatric: Mood and affect normal Neck: No JVD, no carotid bruits, no thyromegaly, no lymphadenopathy. Lungs:Clear bilaterally, no wheezes, rhonci, crackles Cardiovascular: Irregular rate and rhythm. No murmurs, gallops or rubs. Abdomen:Soft. Bowel sounds present. Non-tender.  Extremities: No lower extremity edema.

## 2012-02-25 NOTE — Assessment & Plan Note (Addendum)
Rate controlled. Continue amiodarone and beta blocker. Continue coumadin. Will check CXR and TSH. He has eye exams every 3 months.

## 2012-02-25 NOTE — Patient Instructions (Addendum)
Your physician wants you to follow-up in:6 months.  You will receive a reminder letter in the mail two months in advance. If you don't receive a letter, please call our office to schedule the follow-up appointment.  Please have a chest-Xray done at [place] office on [facility]. Tomorrow or one day next week will be fine.   Your physician has requested that you have an echocardiogram. Echocardiography is a painless test that uses sound waves to create images of your heart. It provides your doctor with information about the size and shape of your heart and how well your heart's chambers and valves are working. This procedure takes approximately one hour. There are no restrictions for this procedure.

## 2012-02-25 NOTE — Assessment & Plan Note (Addendum)
Stable. Continue medical management. Lipids and BP at goal. Will check Echo to assess LV function as this has not been done in several years.

## 2012-02-26 ENCOUNTER — Ambulatory Visit (INDEPENDENT_AMBULATORY_CARE_PROVIDER_SITE_OTHER)
Admission: RE | Admit: 2012-02-26 | Discharge: 2012-02-26 | Disposition: A | Discharge status: Home / Self Care | Payer: Medicare Other | Source: Ambulatory Visit | Attending: Cardiovascular Disease | Admitting: Cardiovascular Disease

## 2012-02-26 DIAGNOSIS — I2510 Atherosclerotic heart disease of native coronary artery without angina pectoris: Secondary | ICD-10-CM

## 2012-03-02 ENCOUNTER — Ambulatory Visit (HOSPITAL_COMMUNITY): Payer: Medicare Other | Attending: Internal Medicine | Admitting: Radiology

## 2012-03-02 DIAGNOSIS — I4891 Unspecified atrial fibrillation: Secondary | ICD-10-CM | POA: Insufficient documentation

## 2012-03-02 DIAGNOSIS — I2510 Atherosclerotic heart disease of native coronary artery without angina pectoris: Secondary | ICD-10-CM

## 2012-03-02 DIAGNOSIS — E785 Hyperlipidemia, unspecified: Secondary | ICD-10-CM | POA: Insufficient documentation

## 2012-03-02 DIAGNOSIS — I10 Essential (primary) hypertension: Secondary | ICD-10-CM | POA: Insufficient documentation

## 2012-03-02 DIAGNOSIS — I509 Heart failure, unspecified: Secondary | ICD-10-CM | POA: Insufficient documentation

## 2012-03-02 DIAGNOSIS — I2589 Other forms of chronic ischemic heart disease: Secondary | ICD-10-CM | POA: Insufficient documentation

## 2012-03-02 DIAGNOSIS — I379 Nonrheumatic pulmonary valve disorder, unspecified: Secondary | ICD-10-CM | POA: Insufficient documentation

## 2012-03-02 DIAGNOSIS — I059 Rheumatic mitral valve disease, unspecified: Secondary | ICD-10-CM | POA: Insufficient documentation

## 2012-03-02 NOTE — Progress Notes (Signed)
Echocardiogram performed.  

## 2012-03-04 ENCOUNTER — Other Ambulatory Visit: Payer: Self-pay | Admitting: Pharmacist

## 2012-03-04 MED ORDER — WARFARIN SODIUM 2 MG PO TABS: ORAL_TABLET | ORAL | Status: DC

## 2012-03-17 ENCOUNTER — Ambulatory Visit (INDEPENDENT_AMBULATORY_CARE_PROVIDER_SITE_OTHER): Payer: Medicare Other | Admitting: Pharmacist

## 2012-03-17 DIAGNOSIS — Z7901 Long term (current) use of anticoagulants: Secondary | ICD-10-CM

## 2012-03-17 DIAGNOSIS — I4891 Unspecified atrial fibrillation: Secondary | ICD-10-CM

## 2012-03-17 DIAGNOSIS — I6350 Cerebral infarction due to unspecified occlusion or stenosis of unspecified cerebral artery: Secondary | ICD-10-CM

## 2012-03-17 LAB — POCT INR: INR: 3

## 2012-03-21 ENCOUNTER — Other Ambulatory Visit: Payer: Self-pay | Admitting: Cardiovascular Disease

## 2012-03-22 ENCOUNTER — Other Ambulatory Visit: Payer: Self-pay | Admitting: Cardiovascular Disease

## 2012-04-28 ENCOUNTER — Ambulatory Visit (INDEPENDENT_AMBULATORY_CARE_PROVIDER_SITE_OTHER): Payer: Medicare Other | Admitting: *Deleted

## 2012-04-28 DIAGNOSIS — Z7901 Long term (current) use of anticoagulants: Secondary | ICD-10-CM

## 2012-04-28 DIAGNOSIS — I4891 Unspecified atrial fibrillation: Secondary | ICD-10-CM

## 2012-04-28 DIAGNOSIS — I6350 Cerebral infarction due to unspecified occlusion or stenosis of unspecified cerebral artery: Secondary | ICD-10-CM

## 2012-04-28 LAB — POCT INR: INR: 2.9

## 2012-05-30 ENCOUNTER — Other Ambulatory Visit: Payer: Self-pay | Admitting: Cardiovascular Disease

## 2012-06-09 ENCOUNTER — Ambulatory Visit (INDEPENDENT_AMBULATORY_CARE_PROVIDER_SITE_OTHER): Payer: Medicare Other

## 2012-06-09 DIAGNOSIS — I4891 Unspecified atrial fibrillation: Secondary | ICD-10-CM

## 2012-06-09 DIAGNOSIS — I6350 Cerebral infarction due to unspecified occlusion or stenosis of unspecified cerebral artery: Secondary | ICD-10-CM

## 2012-06-09 DIAGNOSIS — Z7901 Long term (current) use of anticoagulants: Secondary | ICD-10-CM

## 2012-06-09 LAB — POCT INR: INR: 2.8

## 2012-06-09 MED ORDER — WARFARIN SODIUM 2 MG PO TABS: ORAL_TABLET | ORAL | Status: DC

## 2012-07-21 ENCOUNTER — Ambulatory Visit (INDEPENDENT_AMBULATORY_CARE_PROVIDER_SITE_OTHER): Payer: Medicare Other | Admitting: *Deleted

## 2012-07-21 DIAGNOSIS — I6350 Cerebral infarction due to unspecified occlusion or stenosis of unspecified cerebral artery: Secondary | ICD-10-CM

## 2012-07-21 DIAGNOSIS — Z7901 Long term (current) use of anticoagulants: Secondary | ICD-10-CM

## 2012-07-21 DIAGNOSIS — I4891 Unspecified atrial fibrillation: Secondary | ICD-10-CM

## 2012-07-21 LAB — POCT INR: INR: 2.4

## 2012-09-02 ENCOUNTER — Ambulatory Visit: Payer: Medicare Other | Admitting: Cardiovascular Disease

## 2012-09-11 ENCOUNTER — Other Ambulatory Visit: Payer: Self-pay | Admitting: Cardiovascular Disease

## 2012-09-23 ENCOUNTER — Ambulatory Visit: Payer: Medicare Other | Admitting: Cardiovascular Disease

## 2012-09-27 ENCOUNTER — Encounter: Payer: Self-pay | Admitting: Cardiovascular Disease

## 2012-10-03 ENCOUNTER — Other Ambulatory Visit: Payer: Self-pay | Admitting: *Deleted

## 2012-10-03 ENCOUNTER — Ambulatory Visit (INDEPENDENT_AMBULATORY_CARE_PROVIDER_SITE_OTHER): Payer: Medicare Other | Admitting: *Deleted

## 2012-10-03 DIAGNOSIS — I6350 Cerebral infarction due to unspecified occlusion or stenosis of unspecified cerebral artery: Secondary | ICD-10-CM

## 2012-10-03 DIAGNOSIS — I4891 Unspecified atrial fibrillation: Secondary | ICD-10-CM

## 2012-10-03 DIAGNOSIS — Z7901 Long term (current) use of anticoagulants: Secondary | ICD-10-CM

## 2012-10-03 LAB — POCT INR: INR: 1.7

## 2012-10-03 MED ORDER — AMIODARONE HCL 200 MG PO TABS: 100.0000 mg | ORAL_TABLET | Freq: Every day | ORAL | Status: DC

## 2012-10-10 ENCOUNTER — Ambulatory Visit (INDEPENDENT_AMBULATORY_CARE_PROVIDER_SITE_OTHER): Payer: Medicare Other | Admitting: Cardiovascular Disease

## 2012-10-10 ENCOUNTER — Encounter: Payer: Self-pay | Admitting: Cardiovascular Disease

## 2012-10-10 VITALS — BP 128/76 | HR 59 | Ht 67.0 in | Wt 196.0 lb

## 2012-10-10 DIAGNOSIS — I4891 Unspecified atrial fibrillation: Secondary | ICD-10-CM

## 2012-10-10 DIAGNOSIS — I10 Essential (primary) hypertension: Secondary | ICD-10-CM

## 2012-10-10 DIAGNOSIS — I2510 Atherosclerotic heart disease of native coronary artery without angina pectoris: Secondary | ICD-10-CM

## 2012-10-10 MED ORDER — HYDRALAZINE HCL 50 MG PO TABS: 50.0000 mg | ORAL_TABLET | Freq: Three times a day (TID) | ORAL | Status: DC

## 2012-10-10 MED ORDER — RAMIPRIL 10 MG PO CAPS: 10.0000 mg | ORAL_CAPSULE | Freq: Every day | ORAL | Status: DC

## 2012-10-10 NOTE — Progress Notes (Signed)
History of Present Illness: 79 yo male with h/o CAD, paroxysmal atrial fibrillation, CVA, diastolic CHF, HTN, HLD, CRI here today for cardiac follow up. He has been followed in the past by Dr. Juanda Chance. He has had multiple PCI. He also has paroxysmal atrial fibrillation which is treated with amiodarone and Coumadin. He had a previous stroke with left-sided weakness a number of years ago. He walks with a cane. He has a history of diastolic CHF and his last ejection fraction was 50-55% by echo August 2013. I last saw him in August 2013.   He is here today for cardiac follow up. He tells me today that his is doing well. He has had no chest pain. His breathing has been ok. No dizziness, near syncope, syncope or palpitations. He is tolerating his medications. He is on coumadin. No bleeding.   Primary Care Physician: Renaye Rakers  Last Lipid Profile:Lipid Panel     Component Value Date/Time   CHOL 143 08/31/2011 1111   TRIG 53.0 08/31/2011 1111   HDL 61.90 08/31/2011 1111   CHOLHDL 2 08/31/2011 1111   VLDL 10.6 08/31/2011 1111   LDLCALC 71 08/31/2011 1111     Past Medical History  Diagnosis Date  . Hx of adenomatous colonic polyps     hx?  . Diverticulosis of colon   . MI (myocardial infarction)   . Cellulitis     arm  . BPH (benign prostatic hypertrophy)     hx of; s/p TURP  . Venous insufficiency of leg     lower extremity edema; probably an element of diastolic heart failure and this was previously aggrevated by Norvasc  . HLD (hyperlipidemia)   . HTN (hypertension)   . CVA (cerebral vascular accident)     hx  . Atrial fibrillation     embolic stroke secondary to a-fi; paroxysmal a-fib, controlled on amiodarone and coumadin    . CHF (congestive heart failure)   . Ischemic cardiomyopathy   . CAD (coronary artery disease)     s/p remote diaphragmatic wall infarction and multiple percutaneous coronary interventions. EF 50%  . Chronic renal insufficiency     creatinine 1.9     Past  Surgical History  Procedure Laterality Date  . Turp  1989  . Angioplasty/stent placement      Current Outpatient Prescriptions  Medication Sig Dispense Refill  . amiodarone (PACERONE) 200 MG tablet Take 0.5 tablets (100 mg total) by mouth daily.  45 tablet  3  . aspirin 81 MG EC tablet Take 81 mg by mouth daily.        . furosemide (LASIX) 40 MG tablet TAKE 1 TABLET DAILY  90 tablet  3  . hydrALAZINE (APRESOLINE) 50 MG tablet TAKE 1 TABLET 3 TIMES A DAY  270 tablet  3  . loratadine (ALLERGY RELIEF) 10 MG dissolvable tablet Take 10 mg by mouth daily.       . magnesium oxide (MAG-OX) 400 MG tablet Take 400 mg by mouth 2 (two) times daily.        . metoprolol succinate (TOPROL-XL) 50 MG 24 hr tablet TAKE 1 TABLET TWICE A DAY  180 tablet  3  . pravastatin (PRAVACHOL) 20 MG tablet TAKE 1 TABLET AT BEDTIME  90 tablet  3  . RESTASIS 0.05 % ophthalmic emulsion As directed      . warfarin (COUMADIN) 2 MG tablet Take as directed by Anticoagulation clinic  135 tablet  1  . valsartan (DIOVAN) 160 MG tablet Take  1 tablet (160 mg total) by mouth daily.  90 tablet  3   No current facility-administered medications for this visit.    No Known Allergies  History   Social History  . Marital Status: Widowed    Spouse Name: N/A    Number of Children: N/A  . Years of Education: N/A   Occupational History  . Not on file.   Social History Main Topics  . Smoking status: Former Games developer  . Smokeless tobacco: Never Used     Comment: nonsmoker  . Alcohol Use: No  . Drug Use: Not on file  . Sexually Active: Not on file   Other Topics Concern  . Not on file   Social History Narrative   Worked as a Midwife.     Family History  Problem Relation Age of Onset  . Coronary artery disease      family hx     Review of Systems:  As stated in the HPI and otherwise negative.   BP 128/76  Pulse 59  Ht 5\' 7"  (1.702 m)  Wt 196 lb (88.905 kg)  BMI 30.69 kg/m2  Physical Examination: General:  Well developed, well nourished, NAD HEENT: OP clear, mucus membranes moist SKIN: warm, dry. No rashes. Neuro: No focal deficits Musculoskeletal: Muscle strength 5/5 all ext Psychiatric: Mood and affect normal Neck: No JVD, no carotid bruits, no thyromegaly, no lymphadenopathy. Lungs:Clear bilaterally, no wheezes, rhonci, crackles Cardiovascular: Regular rate and rhythm. No murmurs, gallops or rubs. Abdomen:Soft. Bowel sounds present. Non-tender.  Extremities: No lower extremity edema. Pulses are 2 + in the bilateral DP/PT.  EKG: Sinus brady, rate 59 bpm. Diffuse T wave inversions, unchanged.   Echo 03/02/12: The study was technically difficult, as a result of poor sound wave transmission. - Left ventricle: The cavity size was normal. Wall thickness was increased in a pattern of mild LVH. Systolic function was normal. The estimated ejection fraction was in the range of 50% to 55%. - Left atrium: The atrium was moderately dilated.  Assessment and Plan:   1. ATRIAL FIBRILLATION: sinus today. Continue amiodarone and beta blocker. Continue coumadin. CXR and TSH ok August 2013. He has eye exams every 3 months.   Repeat CXR and TSH in August/september.     2. CAD: Stable. Continue medical management. Lipids and BP at goal.  3. HTN: He is off of Diovan and back on Ramipril 10 mg po Qdaily. Will refill today. No other changes.

## 2012-10-10 NOTE — Patient Instructions (Addendum)
Your physician wants you to follow-up in:  6 months. You will receive a reminder letter in the mail two months in advance. If you don't receive a letter, please call our office to schedule the follow-up appointment.  Continue Ramipril 10 mg by mouth daily

## 2012-10-31 ENCOUNTER — Ambulatory Visit (INDEPENDENT_AMBULATORY_CARE_PROVIDER_SITE_OTHER): Payer: Medicare Other | Admitting: *Deleted

## 2012-10-31 DIAGNOSIS — I4891 Unspecified atrial fibrillation: Secondary | ICD-10-CM

## 2012-10-31 DIAGNOSIS — Z7901 Long term (current) use of anticoagulants: Secondary | ICD-10-CM

## 2012-10-31 DIAGNOSIS — I6350 Cerebral infarction due to unspecified occlusion or stenosis of unspecified cerebral artery: Secondary | ICD-10-CM

## 2012-10-31 LAB — POCT INR: INR: 1.5

## 2012-11-21 ENCOUNTER — Ambulatory Visit (INDEPENDENT_AMBULATORY_CARE_PROVIDER_SITE_OTHER): Payer: Medicare Other | Admitting: Pharmacist

## 2012-11-21 DIAGNOSIS — I4891 Unspecified atrial fibrillation: Secondary | ICD-10-CM

## 2012-11-21 DIAGNOSIS — Z7901 Long term (current) use of anticoagulants: Secondary | ICD-10-CM

## 2012-11-21 DIAGNOSIS — I6350 Cerebral infarction due to unspecified occlusion or stenosis of unspecified cerebral artery: Secondary | ICD-10-CM

## 2012-11-21 LAB — POCT INR: INR: 1.9

## 2012-12-15 ENCOUNTER — Ambulatory Visit (INDEPENDENT_AMBULATORY_CARE_PROVIDER_SITE_OTHER): Payer: Medicare Other

## 2012-12-15 DIAGNOSIS — Z7901 Long term (current) use of anticoagulants: Secondary | ICD-10-CM

## 2012-12-15 DIAGNOSIS — I6350 Cerebral infarction due to unspecified occlusion or stenosis of unspecified cerebral artery: Secondary | ICD-10-CM

## 2012-12-15 DIAGNOSIS — I4891 Unspecified atrial fibrillation: Secondary | ICD-10-CM

## 2012-12-23 ENCOUNTER — Telehealth: Payer: Self-pay | Admitting: Cardiovascular Disease

## 2012-12-23 MED ORDER — WARFARIN SODIUM 2 MG PO TABS: ORAL_TABLET | ORAL | Status: DC

## 2012-12-23 NOTE — Telephone Encounter (Signed)
Refill of Warfarin done as requested

## 2012-12-23 NOTE — Telephone Encounter (Signed)
Refill Request     WARFARIN 2 mg to [facility] on [address].

## 2013-01-12 ENCOUNTER — Ambulatory Visit (INDEPENDENT_AMBULATORY_CARE_PROVIDER_SITE_OTHER): Payer: Medicare Other | Admitting: *Deleted

## 2013-01-12 DIAGNOSIS — I6350 Cerebral infarction due to unspecified occlusion or stenosis of unspecified cerebral artery: Secondary | ICD-10-CM

## 2013-01-12 DIAGNOSIS — I4891 Unspecified atrial fibrillation: Secondary | ICD-10-CM

## 2013-01-12 DIAGNOSIS — Z7901 Long term (current) use of anticoagulants: Secondary | ICD-10-CM

## 2013-01-12 LAB — POCT INR: INR: 2.1

## 2013-02-09 ENCOUNTER — Ambulatory Visit (INDEPENDENT_AMBULATORY_CARE_PROVIDER_SITE_OTHER): Payer: Medicare Other | Admitting: *Deleted

## 2013-02-09 DIAGNOSIS — I4891 Unspecified atrial fibrillation: Secondary | ICD-10-CM

## 2013-02-09 DIAGNOSIS — I6350 Cerebral infarction due to unspecified occlusion or stenosis of unspecified cerebral artery: Secondary | ICD-10-CM

## 2013-02-09 DIAGNOSIS — Z7901 Long term (current) use of anticoagulants: Secondary | ICD-10-CM

## 2013-02-09 LAB — POCT INR: INR: 2

## 2013-02-22 ENCOUNTER — Other Ambulatory Visit: Payer: Self-pay

## 2013-03-09 ENCOUNTER — Ambulatory Visit (INDEPENDENT_AMBULATORY_CARE_PROVIDER_SITE_OTHER): Payer: Medicare Other | Admitting: *Deleted

## 2013-03-09 DIAGNOSIS — I6350 Cerebral infarction due to unspecified occlusion or stenosis of unspecified cerebral artery: Secondary | ICD-10-CM

## 2013-03-09 DIAGNOSIS — I4891 Unspecified atrial fibrillation: Secondary | ICD-10-CM

## 2013-03-09 DIAGNOSIS — Z7901 Long term (current) use of anticoagulants: Secondary | ICD-10-CM

## 2013-03-10 ENCOUNTER — Other Ambulatory Visit: Payer: Self-pay | Admitting: *Deleted

## 2013-03-10 DIAGNOSIS — I872 Venous insufficiency (chronic) (peripheral): Secondary | ICD-10-CM

## 2013-03-14 ENCOUNTER — Ambulatory Visit
Admission: RE | Admit: 2013-03-14 | Discharge: 2013-03-14 | Disposition: A | Discharge status: Home / Self Care | Payer: Medicare Other | Source: Ambulatory Visit | Attending: Family Medicine | Admitting: Family Medicine

## 2013-03-14 ENCOUNTER — Other Ambulatory Visit: Payer: Self-pay | Admitting: Family Medicine

## 2013-03-14 DIAGNOSIS — M25551 Pain in right hip: Secondary | ICD-10-CM

## 2013-03-25 ENCOUNTER — Other Ambulatory Visit: Payer: Self-pay | Admitting: Cardiovascular Disease

## 2013-03-30 ENCOUNTER — Ambulatory Visit (INDEPENDENT_AMBULATORY_CARE_PROVIDER_SITE_OTHER): Payer: Medicare Other | Admitting: Pharmacist

## 2013-03-30 DIAGNOSIS — I6350 Cerebral infarction due to unspecified occlusion or stenosis of unspecified cerebral artery: Secondary | ICD-10-CM

## 2013-03-30 DIAGNOSIS — I4891 Unspecified atrial fibrillation: Secondary | ICD-10-CM

## 2013-03-30 DIAGNOSIS — Z7901 Long term (current) use of anticoagulants: Secondary | ICD-10-CM

## 2013-04-18 ENCOUNTER — Ambulatory Visit (INDEPENDENT_AMBULATORY_CARE_PROVIDER_SITE_OTHER): Payer: Medicare Other | Admitting: Cardiovascular Disease

## 2013-04-18 ENCOUNTER — Encounter: Payer: Self-pay | Admitting: Cardiovascular Disease

## 2013-04-18 VITALS — BP 120/62 | HR 63 | Ht 67.0 in | Wt 190.0 lb

## 2013-04-18 DIAGNOSIS — I2510 Atherosclerotic heart disease of native coronary artery without angina pectoris: Secondary | ICD-10-CM

## 2013-04-18 DIAGNOSIS — I739 Peripheral vascular disease, unspecified: Secondary | ICD-10-CM

## 2013-04-18 DIAGNOSIS — I10 Essential (primary) hypertension: Secondary | ICD-10-CM

## 2013-04-18 DIAGNOSIS — I4891 Unspecified atrial fibrillation: Secondary | ICD-10-CM

## 2013-04-18 MED ORDER — HYDRALAZINE HCL 50 MG PO TABS: 50.0000 mg | ORAL_TABLET | Freq: Three times a day (TID) | ORAL | Status: DC

## 2013-04-18 NOTE — Patient Instructions (Addendum)
Your physician wants you to follow-up in:  6 months.  You will receive a reminder letter in the mail two months in advance. If you don't receive a letter, please call our office to schedule the follow-up appointment.  Your physician has requested that you have a lower or upper extremity arterial duplex. This test is an ultrasound of the arteries in the legs or arms. It looks at arterial blood flow in the legs and arms. Allow one hour for Lower and Upper Arterial scans. There are no restrictions or special instructions   Have chest X-ray done at [facility] office on [facility]

## 2013-04-18 NOTE — Progress Notes (Signed)
History of Present Illness: 79 yo male with h/o CAD, paroxysmal atrial fibrillation, CVA, diastolic CHF, HTN, HLD, CRI, PAD here today for cardiac follow up. He has been followed in the past by Dr. Juanda Chance. He has had multiple PCI. He also has paroxysmal atrial fibrillation which is treated with amiodarone and Coumadin. He had a previous stroke with left-sided weakness a number of years ago. He walks with a cane. He has a history of diastolic CHF and his last ejection fraction was 50-55% by echo August 2013. He has paperwork stating he needed ABI/lower ext dopplers at [facility] in may 2014.   He is here today for cardiac follow up. He tells me today that his is doing well. He has had no chest pain. His breathing has been ok. No dizziness, near syncope, syncope or palpitations. He is tolerating his medications. He is on coumadin. No bleeding.   Primary Care Physician: Renaye Rakers  Last Lipid Profile:Lipid Panel     Component Value Date/Time   CHOL 143 08/31/2011 1111   TRIG 53.0 08/31/2011 1111   HDL 61.90 08/31/2011 1111   CHOLHDL 2 08/31/2011 1111   VLDL 10.6 08/31/2011 1111   LDLCALC 71 08/31/2011 1111     Past Medical History  Diagnosis Date  . Hx of adenomatous colonic polyps     hx?  . Diverticulosis of colon   . MI (myocardial infarction)   . Cellulitis     arm  . BPH (benign prostatic hypertrophy)     hx of; s/p TURP  . Venous insufficiency of leg     lower extremity edema; probably an element of diastolic heart failure and this was previously aggrevated by Norvasc  . HLD (hyperlipidemia)   . HTN (hypertension)   . CVA (cerebral vascular accident)     hx  . Atrial fibrillation     embolic stroke secondary to a-fi; paroxysmal a-fib, controlled on amiodarone and coumadin    . CHF (congestive heart failure)   . Ischemic cardiomyopathy   . CAD (coronary artery disease)     s/p remote diaphragmatic wall infarction and multiple percutaneous coronary interventions. EF 50%  . Chronic  renal insufficiency     creatinine 1.9     Past Surgical History  Procedure Laterality Date  . Turp  1989  . Angioplasty/stent placement      Current Outpatient Prescriptions  Medication Sig Dispense Refill  . amiodarone (PACERONE) 200 MG tablet Take 0.5 tablets (100 mg total) by mouth daily.  45 tablet  3  . aspirin 81 MG EC tablet Take 81 mg by mouth daily.        Marland Kitchen BEPREVE 1.5 % SOLN       . furosemide (LASIX) 40 MG tablet TAKE 1 TABLET DAILY  90 tablet  3  . hydrALAZINE (APRESOLINE) 50 MG tablet Take 1 tablet (50 mg total) by mouth 3 (three) times daily.  45 tablet  0  . loratadine (ALLERGY RELIEF) 10 MG dissolvable tablet Take 10 mg by mouth daily.       . magnesium oxide (MAG-OX) 400 MG tablet Take 400 mg by mouth 2 (two) times daily.        . metoprolol succinate (TOPROL-XL) 50 MG 24 hr tablet TAKE 1 TABLET TWICE A DAY  180 tablet  3  . pravastatin (PRAVACHOL) 20 MG tablet TAKE 1 TABLET AT BEDTIME  90 tablet  3  . ramipril (ALTACE) 10 MG capsule Take 1 capsule (10 mg total) by mouth  daily.  90 capsule  3  . warfarin (COUMADIN) 2 MG tablet Take as directed by Anticoagulation clinic  135 tablet  1   No current facility-administered medications for this visit.    No Known Allergies  History   Social History  . Marital Status: Widowed    Spouse Name: N/A    Number of Children: N/A  . Years of Education: N/A   Occupational History  . Not on file.   Social History Main Topics  . Smoking status: Former Games developer  . Smokeless tobacco: Never Used     Comment: nonsmoker  . Alcohol Use: No  . Drug Use: Not on file  . Sexual Activity: Not on file   Other Topics Concern  . Not on file   Social History Narrative   Worked as a Midwife.     Family History  Problem Relation Age of Onset  . Coronary artery disease      family hx     Review of Systems:  As stated in the HPI and otherwise negative.   BP 120/62  Pulse 63  Ht 5\' 7"  (1.702 m)  Wt 190 lb (86.183 kg)   BMI 29.75 kg/m2  Physical Examination: General: Well developed, well nourished, NAD HEENT: OP clear, mucus membranes moist SKIN: warm, dry. No rashes. Neuro: No focal deficits Musculoskeletal: Muscle strength 5/5 all ext Psychiatric: Mood and affect normal Neck: No JVD, no carotid bruits, no thyromegaly, no lymphadenopathy. Lungs:Clear bilaterally, no wheezes, rhonci, crackles Cardiovascular: Regular rate and rhythm. No murmurs, gallops or rubs. Abdomen:Soft. Bowel sounds present. Non-tender.  Extremities: No lower extremity edema. Pulses are 2 + in the bilateral DP/PT.  EKG: Sinus rate 63 bpm. Diffuse T wave inversions, unchanged.   Echo 03/02/12: The study was technically difficult, as a result of poor sound wave transmission. - Left ventricle: The cavity size was normal. Wall thickness was increased in a pattern of mild LVH. Systolic function was normal. The estimated ejection fraction was in the range of 50% to 55%. - Left atrium: The atrium was moderately dilated.  Assessment and Plan:   1. ATRIAL FIBRILLATION: Sinus today. Continue amiodarone and beta blocker. Continue coumadin. CXR and TSH ok August 2013. He has eye exams every 3 months.   Repeat CXR and TSH need to be arranged today.     2. CAD: Stable. Continue medical management. Lipids and BP at goal.  3. HTN: BP well controlled. No changes.   4. PAD: Will arrange LE arterial dopplers in our office.

## 2013-04-25 ENCOUNTER — Ambulatory Visit (HOSPITAL_COMMUNITY): Payer: Medicare Other | Attending: Cardiology

## 2013-04-25 DIAGNOSIS — Z87891 Personal history of nicotine dependence: Secondary | ICD-10-CM | POA: Insufficient documentation

## 2013-04-25 DIAGNOSIS — I2510 Atherosclerotic heart disease of native coronary artery without angina pectoris: Secondary | ICD-10-CM | POA: Insufficient documentation

## 2013-04-25 DIAGNOSIS — M79609 Pain in unspecified limb: Secondary | ICD-10-CM | POA: Insufficient documentation

## 2013-04-25 DIAGNOSIS — R209 Unspecified disturbances of skin sensation: Secondary | ICD-10-CM | POA: Insufficient documentation

## 2013-04-25 DIAGNOSIS — I10 Essential (primary) hypertension: Secondary | ICD-10-CM | POA: Insufficient documentation

## 2013-04-25 DIAGNOSIS — E785 Hyperlipidemia, unspecified: Secondary | ICD-10-CM | POA: Insufficient documentation

## 2013-04-25 DIAGNOSIS — I739 Peripheral vascular disease, unspecified: Secondary | ICD-10-CM

## 2013-04-26 ENCOUNTER — Ambulatory Visit (INDEPENDENT_AMBULATORY_CARE_PROVIDER_SITE_OTHER)
Admission: RE | Admit: 2013-04-26 | Discharge: 2013-04-26 | Disposition: A | Discharge status: Home / Self Care | Payer: Medicare Other | Source: Ambulatory Visit | Attending: Cardiovascular Disease | Admitting: Cardiovascular Disease

## 2013-04-26 DIAGNOSIS — I2510 Atherosclerotic heart disease of native coronary artery without angina pectoris: Secondary | ICD-10-CM

## 2013-04-26 DIAGNOSIS — I4891 Unspecified atrial fibrillation: Secondary | ICD-10-CM

## 2013-05-02 ENCOUNTER — Other Ambulatory Visit: Payer: Self-pay | Admitting: *Deleted

## 2013-05-02 ENCOUNTER — Other Ambulatory Visit (INDEPENDENT_AMBULATORY_CARE_PROVIDER_SITE_OTHER): Payer: Medicare Other

## 2013-05-02 DIAGNOSIS — R918 Other nonspecific abnormal finding of lung field: Secondary | ICD-10-CM

## 2013-05-02 DIAGNOSIS — R222 Localized swelling, mass and lump, trunk: Secondary | ICD-10-CM

## 2013-05-03 LAB — BASIC METABOLIC PANEL
BUN: 18 mg/dL (ref 6–23)
CO2: 29 mEq/L (ref 19–32)
Chloride: 106 mEq/L (ref 96–112)
GFR: 49.96 mL/min — ABNORMAL LOW (ref >60.00)
Glucose, Bld: 91 mg/dL (ref 70–99)
Potassium: 3.9 mEq/L (ref 3.5–5.1)

## 2013-05-05 ENCOUNTER — Ambulatory Visit (INDEPENDENT_AMBULATORY_CARE_PROVIDER_SITE_OTHER)
Admission: RE | Admit: 2013-05-05 | Discharge: 2013-05-05 | Disposition: A | Discharge status: Home / Self Care | Payer: Medicare Other | Source: Ambulatory Visit | Attending: Cardiovascular Disease | Admitting: Cardiovascular Disease

## 2013-05-05 DIAGNOSIS — R918 Other nonspecific abnormal finding of lung field: Secondary | ICD-10-CM

## 2013-05-05 DIAGNOSIS — R222 Localized swelling, mass and lump, trunk: Secondary | ICD-10-CM

## 2013-05-05 MED ORDER — IOHEXOL 300 MG/ML  SOLN
80.0000 mL | Freq: Once | INTRAMUSCULAR | Status: AC | PRN
  Administered 2013-05-05: 80 mL via INTRAVENOUS

## 2013-05-11 ENCOUNTER — Ambulatory Visit (INDEPENDENT_AMBULATORY_CARE_PROVIDER_SITE_OTHER): Payer: Medicare Other | Admitting: *Deleted

## 2013-05-11 DIAGNOSIS — Z7901 Long term (current) use of anticoagulants: Secondary | ICD-10-CM

## 2013-05-11 DIAGNOSIS — I4891 Unspecified atrial fibrillation: Secondary | ICD-10-CM

## 2013-05-11 DIAGNOSIS — I6350 Cerebral infarction due to unspecified occlusion or stenosis of unspecified cerebral artery: Secondary | ICD-10-CM

## 2013-05-25 ENCOUNTER — Other Ambulatory Visit: Payer: Self-pay

## 2013-05-28 ENCOUNTER — Other Ambulatory Visit: Payer: Self-pay | Admitting: Cardiovascular Disease

## 2013-06-01 ENCOUNTER — Encounter: Payer: Self-pay | Admitting: Podiatry

## 2013-06-01 ENCOUNTER — Ambulatory Visit (INDEPENDENT_AMBULATORY_CARE_PROVIDER_SITE_OTHER): Payer: Medicare Other | Admitting: Podiatry

## 2013-06-01 VITALS — BP 173/66 | HR 67 | Resp 12 | Ht 67.0 in | Wt 190.0 lb

## 2013-06-01 DIAGNOSIS — B351 Tinea unguium: Secondary | ICD-10-CM

## 2013-06-01 DIAGNOSIS — M79609 Pain in unspecified limb: Secondary | ICD-10-CM

## 2013-06-01 NOTE — Progress Notes (Signed)
Subjective:     Patient ID: [name], male   DOB: Jun 30, 1927, 79 y.o.   MRN: [id number]  HPI patient is found to have nail disease with thickness 1-5 both feet   Review of Systems     Objective:   Physical Exam Neurovascular unchanged with painful nailbeds and thickness 1-5 both feet    Assessment:     Mycotic nail infection with pain 1-5 both feet    Plan:     Debridement nailbeds 1-5 both feet with no iatrogenic bleeding noted

## 2013-06-22 ENCOUNTER — Ambulatory Visit (INDEPENDENT_AMBULATORY_CARE_PROVIDER_SITE_OTHER): Payer: Medicare Other | Admitting: General Practice

## 2013-06-22 DIAGNOSIS — Z7901 Long term (current) use of anticoagulants: Secondary | ICD-10-CM

## 2013-06-22 DIAGNOSIS — Z23 Encounter for immunization: Secondary | ICD-10-CM

## 2013-06-22 DIAGNOSIS — I6350 Cerebral infarction due to unspecified occlusion or stenosis of unspecified cerebral artery: Secondary | ICD-10-CM

## 2013-06-22 DIAGNOSIS — I4891 Unspecified atrial fibrillation: Secondary | ICD-10-CM

## 2013-07-16 ENCOUNTER — Encounter: Payer: Self-pay | Admitting: Cardiovascular Disease

## 2013-07-17 ENCOUNTER — Other Ambulatory Visit: Payer: Self-pay | Admitting: *Deleted

## 2013-07-17 ENCOUNTER — Other Ambulatory Visit: Payer: Self-pay

## 2013-07-17 MED ORDER — WARFARIN SODIUM 2 MG PO TABS: ORAL_TABLET | ORAL | Status: DC

## 2013-07-19 ENCOUNTER — Other Ambulatory Visit: Payer: Self-pay | Admitting: *Deleted

## 2013-07-19 MED ORDER — WARFARIN SODIUM 2 MG PO TABS: ORAL_TABLET | ORAL | Status: DC

## 2013-07-19 NOTE — Telephone Encounter (Signed)
Need refill of 14 tablets to be sent in to [facility] on [address] until pt receives order from [facility]

## 2013-08-03 ENCOUNTER — Ambulatory Visit (INDEPENDENT_AMBULATORY_CARE_PROVIDER_SITE_OTHER): Payer: Medicare Other

## 2013-08-03 DIAGNOSIS — Z7901 Long term (current) use of anticoagulants: Secondary | ICD-10-CM

## 2013-08-03 DIAGNOSIS — I6350 Cerebral infarction due to unspecified occlusion or stenosis of unspecified cerebral artery: Secondary | ICD-10-CM

## 2013-08-03 DIAGNOSIS — I4891 Unspecified atrial fibrillation: Secondary | ICD-10-CM

## 2013-08-03 LAB — POCT INR: INR: 2.7

## 2013-08-22 ENCOUNTER — Other Ambulatory Visit: Payer: Self-pay | Admitting: Cardiovascular Disease

## 2013-08-31 ENCOUNTER — Ambulatory Visit (INDEPENDENT_AMBULATORY_CARE_PROVIDER_SITE_OTHER): Payer: Medicare Other | Admitting: Podiatry

## 2013-08-31 ENCOUNTER — Encounter: Payer: Self-pay | Admitting: Podiatry

## 2013-08-31 VITALS — BP 131/58 | HR 65 | Resp 16

## 2013-08-31 DIAGNOSIS — M79609 Pain in unspecified limb: Secondary | ICD-10-CM

## 2013-08-31 DIAGNOSIS — B351 Tinea unguium: Secondary | ICD-10-CM

## 2013-09-01 NOTE — Progress Notes (Signed)
Subjective:     Patient ID: KEY CEN, male   DOB: 04-Jul-1927, 80 y.o.   MRN: [id number]  HPI patient presents with painful nailbeds that he cannot reach or cut 1-5 of both feet  Review of Systems     Objective:   Physical Exam Neurovascular status unchanged with patient well oriented x3 in thick painful nail bed 1-5 both feet    Assessment:     Mycotic nail infection is with pain 1-5 of both feet    Plan:     Debridement painful nailbeds with no iatrogenic bleeding 1-5 both feet

## 2013-09-20 ENCOUNTER — Other Ambulatory Visit: Payer: Self-pay | Admitting: Cardiovascular Disease

## 2013-09-21 ENCOUNTER — Ambulatory Visit (INDEPENDENT_AMBULATORY_CARE_PROVIDER_SITE_OTHER): Payer: Medicare Other

## 2013-09-21 DIAGNOSIS — I6350 Cerebral infarction due to unspecified occlusion or stenosis of unspecified cerebral artery: Secondary | ICD-10-CM

## 2013-09-21 DIAGNOSIS — Z7901 Long term (current) use of anticoagulants: Secondary | ICD-10-CM

## 2013-09-21 DIAGNOSIS — I4891 Unspecified atrial fibrillation: Secondary | ICD-10-CM

## 2013-09-21 LAB — POCT INR: INR: 2.4

## 2013-10-31 ENCOUNTER — Ambulatory Visit (INDEPENDENT_AMBULATORY_CARE_PROVIDER_SITE_OTHER): Payer: Medicare Other | Admitting: Pharmacist

## 2013-10-31 ENCOUNTER — Encounter: Payer: Self-pay | Admitting: Cardiovascular Disease

## 2013-10-31 ENCOUNTER — Ambulatory Visit (INDEPENDENT_AMBULATORY_CARE_PROVIDER_SITE_OTHER): Payer: Medicare Other | Admitting: Cardiovascular Disease

## 2013-10-31 VITALS — BP 136/90 | HR 60 | Ht 67.0 in | Wt 186.0 lb

## 2013-10-31 DIAGNOSIS — R079 Chest pain, unspecified: Secondary | ICD-10-CM

## 2013-10-31 DIAGNOSIS — I6350 Cerebral infarction due to unspecified occlusion or stenosis of unspecified cerebral artery: Secondary | ICD-10-CM

## 2013-10-31 DIAGNOSIS — I4891 Unspecified atrial fibrillation: Secondary | ICD-10-CM

## 2013-10-31 DIAGNOSIS — I10 Essential (primary) hypertension: Secondary | ICD-10-CM

## 2013-10-31 DIAGNOSIS — I739 Peripheral vascular disease, unspecified: Secondary | ICD-10-CM

## 2013-10-31 DIAGNOSIS — Z7901 Long term (current) use of anticoagulants: Secondary | ICD-10-CM

## 2013-10-31 DIAGNOSIS — I2510 Atherosclerotic heart disease of native coronary artery without angina pectoris: Secondary | ICD-10-CM

## 2013-10-31 LAB — POCT INR: INR: 1.9

## 2013-10-31 NOTE — Patient Instructions (Signed)
Your physician wants you to follow-up in:  6 months.  You will receive a reminder letter in the mail two months in advance. If you don't receive a letter, please call our office to schedule the follow-up appointment.  Your physician has requested that you have a lexiscan myoview. For further information please visit [email or web address]. Please follow instruction sheet, as given.  Please call [facility] office for pt to schedule appointment for Peripheral Arterial Disease with Dr. Gwenlyn Found.  Pt has seen Dr. Gwenlyn Found in the past

## 2013-10-31 NOTE — Progress Notes (Signed)
History of Present Illness: 80 yo male with h/o CAD, paroxysmal atrial fibrillation, CVA, diastolic CHF, HTN, HLD, CRI, PAD here today for cardiac follow up. He has been followed in the past by Dr. Olevia Perches. He has had multiple PCI. He also has paroxysmal atrial fibrillation which is treated with amiodarone and Coumadin. He had a previous stroke with left-sided weakness a number of years ago. He walks with a cane. He has a history of diastolic CHF and his last ejection fraction was 50-55% by echo August 2013. ABI October 2014 were hard to obtain but TBI normal. I found out today that he has seen Dr. Gwenlyn Found for his PAD but no recent f/u.   He is here today for cardiac follow up. He tells me today that he has had several episodes of exertional chest pain and dyspnea. No dizziness, near syncope, syncope or palpitations. He is tolerating his medications. He is on coumadin. No bleeding. No leg pain with walking.   Primary Care Physician: Lucianne Lei  Last Lipid Profile: Followed in primary care.   Past Medical History  Diagnosis Date  . Hx of adenomatous colonic polyps     hx?  . Diverticulosis of colon   . MI (myocardial infarction)   . Cellulitis     arm  . BPH (benign prostatic hypertrophy)     hx of; s/p TURP  . Venous insufficiency of leg     lower extremity edema; probably an element of diastolic heart failure and this was previously aggrevated by Norvasc  . HLD (hyperlipidemia)   . HTN (hypertension)   . CVA (cerebral vascular accident)     hx  . Atrial fibrillation     embolic stroke secondary to a-fi; paroxysmal a-fib, controlled on amiodarone and coumadin    . CHF (congestive heart failure)   . Ischemic cardiomyopathy   . CAD (coronary artery disease)     s/p remote diaphragmatic wall infarction and multiple percutaneous coronary interventions. EF 50%  . Chronic renal insufficiency     creatinine 1.9     Past Surgical History  Procedure Laterality Date  . Turp  1989  .  Angioplasty/stent placement      Current Outpatient Prescriptions  Medication Sig Dispense Refill  . amiodarone (PACERONE) 200 MG tablet TAKE 1/2 TABLET (100MG      TOTAL) DAILY  45 tablet  3  . aspirin 81 MG EC tablet Take 81 mg by mouth daily.        Marland Kitchen BEPREVE 1.5 % SOLN       . furosemide (LASIX) 40 MG tablet TAKE 1 TABLET DAILY  90 tablet  0  . hydrALAZINE (APRESOLINE) 50 MG tablet Take 1 tablet (50 mg total) by mouth 3 (three) times daily.  270 tablet  3  . loratadine (ALLERGY RELIEF) 10 MG dissolvable tablet Take 10 mg by mouth daily.       . magnesium oxide (MAG-OX) 400 MG tablet Take 400 mg by mouth 2 (two) times daily.        . metoprolol succinate (TOPROL-XL) 50 MG 24 hr tablet TAKE 1 TABLET TWICE A DAY  180 tablet  3  . pravastatin (PRAVACHOL) 20 MG tablet TAKE 1 TABLET AT BEDTIME  90 tablet  3  . ramipril (ALTACE) 10 MG capsule TAKE 1 CAPSULE DAILY  90 capsule  0  . warfarin (COUMADIN) 2 MG tablet Take as directed by Anticoagulation clinic 14 tabs ordered until gets refill from mail order pharmacy  14  tablet  0   No current facility-administered medications for this visit.    No Known Allergies  History   Social History  . Marital Status: Widowed    Spouse Name: N/A    Number of Children: N/A  . Years of Education: N/A   Occupational History  . Not on file.   Social History Main Topics  . Smoking status: Former Research scientist (life sciences)  . Smokeless tobacco: Never Used     Comment: nonsmoker  . Alcohol Use: No  . Drug Use: Not on file  . Sexual Activity: Not on file   Other Topics Concern  . Not on file   Social History Narrative   Worked as a Recruitment consultant.     Family History  Problem Relation Age of Onset  . Coronary artery disease      family hx     Review of Systems:  As stated in the HPI and otherwise negative.   BP 136/90  Pulse 60  Ht 5\' 7"  (1.702 m)  Wt 186 lb (84.369 kg)  BMI 29.12 kg/m2  Physical Examination: General: Well developed, well nourished,  NAD HEENT: OP clear, mucus membranes moist SKIN: warm, dry. No rashes. Neuro: No focal deficits Musculoskeletal: Muscle strength 5/5 all ext Psychiatric: Mood and affect normal Neck: No JVD, no carotid bruits, no thyromegaly, no lymphadenopathy. Lungs:Clear bilaterally, no wheezes, rhonci, crackles Cardiovascular: Regular rate and rhythm. No murmurs, gallops or rubs. Abdomen:Soft. Bowel sounds present. Non-tender.  Extremities: No lower extremity edema. Pulses are 2 + in the bilateral DP/PT.  Echo 03/02/12: The study was technically difficult, as a result of poor sound wave transmission. - Left ventricle: The cavity size was normal. Wall thickness was increased in a pattern of mild LVH. Systolic function was normal. The estimated ejection fraction was in the range of 50% to 55%. - Left atrium: The atrium was moderately dilated.  Assessment and Plan:   1. ATRIAL FIBRILLATION: Sinus today. Continue amiodarone and beta blocker. Continue coumadin. CXR/CT chest and TSH ok October 2014. He has eye exams every 3 months.       2. CAD: Recent chest pains. Will plan Lexiscan stress myoview to exclude ischemia.   3. HTN: BP well controlled. No changes.   4. PAD: Stable. No claudication. He will need to f/u with Dr. Gwenlyn Found in the [facility] clinic.   5. Chest pain: As above, plan cath.

## 2013-11-10 ENCOUNTER — Ambulatory Visit (HOSPITAL_COMMUNITY)
Admission: RE | Admit: 2013-11-10 | Discharge: 2013-11-10 | Disposition: A | Discharge status: Home / Self Care | Payer: Medicare Other | Source: Ambulatory Visit | Attending: Cardiology | Admitting: Cardiology

## 2013-11-10 DIAGNOSIS — R079 Chest pain, unspecified: Secondary | ICD-10-CM | POA: Insufficient documentation

## 2013-11-10 DIAGNOSIS — I509 Heart failure, unspecified: Secondary | ICD-10-CM | POA: Insufficient documentation

## 2013-11-10 DIAGNOSIS — I4891 Unspecified atrial fibrillation: Secondary | ICD-10-CM | POA: Insufficient documentation

## 2013-11-10 DIAGNOSIS — Z87891 Personal history of nicotine dependence: Secondary | ICD-10-CM | POA: Insufficient documentation

## 2013-11-10 DIAGNOSIS — I2510 Atherosclerotic heart disease of native coronary artery without angina pectoris: Secondary | ICD-10-CM | POA: Insufficient documentation

## 2013-11-10 DIAGNOSIS — I739 Peripheral vascular disease, unspecified: Secondary | ICD-10-CM | POA: Insufficient documentation

## 2013-11-10 DIAGNOSIS — Z8249 Family history of ischemic heart disease and other diseases of the circulatory system: Secondary | ICD-10-CM | POA: Insufficient documentation

## 2013-11-10 DIAGNOSIS — Z8673 Personal history of transient ischemic attack (TIA), and cerebral infarction without residual deficits: Secondary | ICD-10-CM | POA: Insufficient documentation

## 2013-11-10 DIAGNOSIS — I252 Old myocardial infarction: Secondary | ICD-10-CM | POA: Insufficient documentation

## 2013-11-10 DIAGNOSIS — R0602 Shortness of breath: Secondary | ICD-10-CM | POA: Insufficient documentation

## 2013-11-10 DIAGNOSIS — R42 Dizziness and giddiness: Secondary | ICD-10-CM | POA: Insufficient documentation

## 2013-11-10 DIAGNOSIS — R5381 Other malaise: Secondary | ICD-10-CM | POA: Insufficient documentation

## 2013-11-10 DIAGNOSIS — R5383 Other fatigue: Secondary | ICD-10-CM

## 2013-11-10 DIAGNOSIS — I2589 Other forms of chronic ischemic heart disease: Secondary | ICD-10-CM | POA: Insufficient documentation

## 2013-11-10 DIAGNOSIS — R0989 Other specified symptoms and signs involving the circulatory and respiratory systems: Secondary | ICD-10-CM | POA: Insufficient documentation

## 2013-11-10 DIAGNOSIS — E669 Obesity, unspecified: Secondary | ICD-10-CM | POA: Insufficient documentation

## 2013-11-10 DIAGNOSIS — I10 Essential (primary) hypertension: Secondary | ICD-10-CM | POA: Insufficient documentation

## 2013-11-10 DIAGNOSIS — R0609 Other forms of dyspnea: Secondary | ICD-10-CM | POA: Insufficient documentation

## 2013-11-10 MED ORDER — TECHNETIUM TC 99M SESTAMIBI GENERIC - CARDIOLITE
10.8000 | Freq: Once | INTRAVENOUS | Status: AC | PRN
  Administered 2013-11-10: 11 via INTRAVENOUS

## 2013-11-10 MED ORDER — TECHNETIUM TC 99M SESTAMIBI GENERIC - CARDIOLITE
30.1000 | Freq: Once | INTRAVENOUS | Status: AC | PRN
  Administered 2013-11-10: 30.1 via INTRAVENOUS

## 2013-11-10 MED ORDER — AMINOPHYLLINE 25 MG/ML IV SOLN
75.0000 mg | Freq: Once | INTRAVENOUS | Status: AC
  Administered 2013-11-10: 75 mg via INTRAVENOUS

## 2013-11-10 MED ORDER — REGADENOSON 0.4 MG/5ML IV SOLN
0.4000 mg | Freq: Once | INTRAVENOUS | Status: AC
  Administered 2013-11-10: 0.4 mg via INTRAVENOUS

## 2013-11-10 NOTE — Procedures (Addendum)
[place] [address] [place] [address] [phone]  Cardiology Nuclear Med Study  [name] is a 80 y.o. male     MRN : [id number]     DOB: 08-04-1926  Procedure Date: 11/10/2013  Nuclear Med Background Indication for Stress Test:  Evaluation for Ischemia History:  CAD;MI;ischemic cardiomyopathy;chronic AFIB;CHF;venous insufficiency;DVT;PAF;Unknown last NUC MPI Cardiac Risk Factors: CVA, Family History - CAD, History of Smoking, Hypertension, Lipids, Obesity and PVD  Symptoms:  Chest Pain, Dizziness, DOE, Fatigue and SOB   Nuclear Pre-Procedure Caffeine/Decaff Intake:  1:00am NPO After: 11am   IV Site: L Forearm  IV 0.9% NS with Angio Cath:  22g  Chest Size (in):  44"  IV Started by: Rolene Course, RN  Height: 5\' 7"  (1.702 m)  Cup Size: n/a  BMI:  Body mass index is 29.12 kg/(m^2). Weight:  186 lb (84.369 kg)   Tech Comments:  n/a    Nuclear Med Study 1 or 2 day study: 1 day  Stress Test Type:  [place] Provider:  Lauree Chandler, MD   Resting Radionuclide: Technetium 41m Sestamibi  Resting Radionuclide Dose: 10.8 mCi   Stress Radionuclide:  Technetium 36m Sestamibi  Stress Radionuclide Dose: 30.1 mCi           Stress Protocol Rest HR: 57 Stress HR: 64  Rest BP: 151/97 Stress BP: 141/101  Exercise Time (min): n/a METS: n/a   Predicted Max HR: 136 bpm % Max HR: 55.15 bpm Rate Pressure Product: 11325  Dose of Adenosine (mg):  n/a Dose of Lexiscan: 0.4 mg  Dose of Atropine (mg): n/a Dose of Dobutamine: n/a mcg/kg/min (at max HR)  Stress Test Technologist: Leane Para, CCT Nuclear Technologist: Imagene Riches, CNMT   Rest Procedure:  Myocardial perfusion imaging was performed at rest 45 minutes following the intravenous administration of Technetium 31m Sestamibi. Stress Procedure:  The patient received IV dobutamine and no IV atropine.  There were no significant changes  with infusion.  Patient experienced SOB and 75 mg of Aminophylline IV was administered at 5 minutes. Technetium 63m Sestamibi was injected IV at peak heart rate and quantitative spect images were obtained after a 45 minute delay.  Transient Ischemic Dilatation (Normal <1.22):  1.21 Lung/Heart Ratio (Normal <0.45):  0.26 QGS EDV:  76 ml QGS ESV:  46 ml LV Ejection Fraction: 39%  Pam Hardin Negus, CNMT  PHYSICIAN INTERPETATION:  Rest ECG: NSR-LBBB and No acute changes  Stress ECG: No significant change from baseline ECG and Uninteretable due to baseline LBBB  QPS  Raw Data Images:  There is notably increased hepatic tracer uptake that partially obscures the inferoseptal wall.   Stress Images:  There is decreased uptake in the anterior wall.  A moderate sized,medium intensity, fixed perfusion defect in the mid to apical anterior-apical wall.  There is decreased uptake in the inferior wall.   There is decreased uptake in the septum. A large sized, severe intensity, fixed perfusion defect in the entire inferior-inferoseptal (basal to apical).  There is decreased uptake in the apex.   No Reversibility is seen.  Rest Images:  There is decreased uptake in the anterior wall. There is decreased uptake in the inferior wall. There is decreased uptake in the septum. Comparison with the stress images reveals no significant change.  LV Wall Motion:  Gated images confirm essentially severe hypokinesis to akinesis of the inferior-inferoseptal wall and at least moderate anterior hypokinesis -- consistent with prior  infarctions in the LAD & RCA distribution.  Subtraction (SDS):  There appear to be 2 separate fixed defects with significant correlating wall motion abnormalities that are most consistent with a previous infarctions. No reversibility is appreciated. No evidence of ischemia.   Impression Exercise Capacity:  Lexiscan with no exercise. BP Response:  Normal blood pressure response. Clinical Symptoms:   There is dyspnea. ECG Impression:  No significant ECG changes with Lexiscan. Comparison with Prior Nuclear Study: No images to compare  Overall Impression:  Intermediate risk stress nuclear study with 2 separate defects consistent with signficant sized infarctions with moderately reduced LVEF.Marland Kitchen  Clinical Correlation is recommended.    Leonie Man, MD  11/10/2013 6:54 PM

## 2013-11-14 ENCOUNTER — Ambulatory Visit (INDEPENDENT_AMBULATORY_CARE_PROVIDER_SITE_OTHER): Payer: Medicare Other | Admitting: Cardiovascular Disease

## 2013-11-14 ENCOUNTER — Encounter: Payer: Self-pay | Admitting: Cardiovascular Disease

## 2013-11-14 ENCOUNTER — Encounter: Payer: Self-pay | Admitting: *Deleted

## 2013-11-14 VITALS — BP 128/68 | HR 60 | Ht 67.0 in | Wt 188.0 lb

## 2013-11-14 DIAGNOSIS — I2510 Atherosclerotic heart disease of native coronary artery without angina pectoris: Secondary | ICD-10-CM

## 2013-11-14 DIAGNOSIS — R079 Chest pain, unspecified: Secondary | ICD-10-CM

## 2013-11-14 DIAGNOSIS — I4891 Unspecified atrial fibrillation: Secondary | ICD-10-CM

## 2013-11-14 DIAGNOSIS — I10 Essential (primary) hypertension: Secondary | ICD-10-CM

## 2013-11-14 DIAGNOSIS — I6350 Cerebral infarction due to unspecified occlusion or stenosis of unspecified cerebral artery: Secondary | ICD-10-CM

## 2013-11-14 NOTE — Progress Notes (Signed)
History of Present Illness: 80 yo male with h/o CAD, paroxysmal atrial fibrillation, CVA, diastolic CHF, HTN, HLD, CRI, PAD here today for cardiac follow up. He has been followed in the past by Dr. Olevia Perches. He has had multiple PCI. He also has paroxysmal atrial fibrillation which is treated with amiodarone and Coumadin. He had a previous stroke with left-sided weakness a number of years ago. He walks with a cane. He has a history of diastolic CHF and his last ejection fraction was 50-55% by echo August 2013. ABI October 2014 were hard to obtain but TBI normal. He is supposed to follow with Dr. Gwenlyn Found for his PAD.    He is here today for cardiac follow up. He continues to have exertional chest pain and SOB.     Primary Care Physician: Lucianne Lei  Last Lipid Profile: Followed in primary care.   Past Medical History  Diagnosis Date  . Hx of adenomatous colonic polyps     hx?  . Diverticulosis of colon   . MI (myocardial infarction)   . Cellulitis     arm  . BPH (benign prostatic hypertrophy)     hx of; s/p TURP  . Venous insufficiency of leg     lower extremity edema; probably an element of diastolic heart failure and this was previously aggrevated by Norvasc  . HLD (hyperlipidemia)   . HTN (hypertension)   . CVA (cerebral vascular accident)     hx  . Atrial fibrillation     embolic stroke secondary to a-fi; paroxysmal a-fib, controlled on amiodarone and coumadin    . CHF (congestive heart failure)   . Ischemic cardiomyopathy   . CAD (coronary artery disease)     s/p remote diaphragmatic wall infarction and multiple percutaneous coronary interventions. EF 50%  . Chronic renal insufficiency     creatinine 1.9     Past Surgical History  Procedure Laterality Date  . Turp  1989  . Angioplasty/stent placement      Current Outpatient Prescriptions  Medication Sig Dispense Refill  . amiodarone (PACERONE) 200 MG tablet TAKE 1/2 TABLET (100MG      TOTAL) DAILY  45 tablet  3  .  aspirin 81 MG EC tablet Take 81 mg by mouth daily.        Marland Kitchen BEPREVE 1.5 % SOLN       . furosemide (LASIX) 40 MG tablet TAKE 1 TABLET DAILY  90 tablet  0  . hydrALAZINE (APRESOLINE) 50 MG tablet Take 1 tablet (50 mg total) by mouth 3 (three) times daily.  270 tablet  3  . loratadine (ALLERGY RELIEF) 10 MG dissolvable tablet Take 10 mg by mouth daily.       . magnesium oxide (MAG-OX) 400 MG tablet Take 400 mg by mouth 2 (two) times daily.        . metoprolol succinate (TOPROL-XL) 50 MG 24 hr tablet TAKE 1 TABLET TWICE A DAY  180 tablet  3  . pravastatin (PRAVACHOL) 20 MG tablet TAKE 1 TABLET AT BEDTIME  90 tablet  3  . ramipril (ALTACE) 10 MG capsule TAKE 1 CAPSULE DAILY  90 capsule  0  . warfarin (COUMADIN) 2 MG tablet Take as directed by Anticoagulation clinic 14 tabs ordered until gets refill from mail order pharmacy  14 tablet  0   No current facility-administered medications for this visit.    No Known Allergies  History   Social History  . Marital Status: Widowed    Spouse  Name: N/A    Number of Children: N/A  . Years of Education: N/A   Occupational History  . Not on file.   Social History Main Topics  . Smoking status: Former Research scientist (life sciences)  . Smokeless tobacco: Never Used     Comment: nonsmoker  . Alcohol Use: No  . Drug Use: Not on file  . Sexual Activity: Not on file   Other Topics Concern  . Not on file   Social History Narrative   Worked as a Recruitment consultant.     Family History  Problem Relation Age of Onset  . Coronary artery disease      family hx     Review of Systems:  As stated in the HPI and otherwise negative.   BP 128/68  Pulse 60  Ht 5\' 7"  (1.702 m)  Wt 188 lb (85.276 kg)  BMI 29.44 kg/m2  Physical Examination: General: Well developed, well nourished, NAD HEENT: OP clear, mucus membranes moist SKIN: warm, dry. No rashes. Neuro: No focal deficits Musculoskeletal: Muscle strength 5/5 all ext Psychiatric: Mood and affect normal Neck: No JVD, no  carotid bruits, no thyromegaly, no lymphadenopathy. Lungs:Clear bilaterally, no wheezes, rhonci, crackles Cardiovascular: Regular rate and rhythm. No murmurs, gallops or rubs. Abdomen:Soft. Bowel sounds present. Non-tender.  Extremities: No lower extremity edema. Pulses are 2 + in the bilateral DP/PT.  Echo 03/02/12: The study was technically difficult, as a result of poor sound wave transmission. - Left ventricle: The cavity size was normal. Wall thickness was increased in a pattern of mild LVH. Systolic function was normal. The estimated ejection fraction was in the range of 50% to 55%. - Left atrium: The atrium was moderately dilated.  Stress myoview 11/10/13: Raw Data Images: There is notably increased hepatic tracer  uptake that partially obscures the inferoseptal wall.   Stress Images: There is decreased uptake in the anterior wall. A moderate sized,medium intensity, fixed perfusion defect in the mid to apical anterior-apical wall. There is decreased uptake in the inferior wall. There is decreased uptake in the septum. A  large sized, severe intensity, fixed perfusion defect in the  entire inferior-inferoseptal (basal to apical). There is  decreased uptake in the apex. No Reversibility is seen.  Rest Images: There is decreased uptake in the anterior wall.  There is decreased uptake in the inferior wall. There is  decreased uptake in the septum. Comparison with the stress images reveals no significant change.  LV Wall Motion: Gated images confirm essentially severe  hypokinesis to akinesis of the inferior-inferoseptal wall and at  least moderate anterior hypokinesis -- consistent with prior  infarctions in the LAD & RCA distribution.  Subtraction (SDS): There appear to be 2 separate fixed defects with significant correlating wall motion abnormalities that are  most consistent with a previous infarctions. No reversibility is  appreciated. No evidence of ischemia.    Impression Exercise Capacity: Lexiscan with no exercise. BP Response: Normal blood pressure response. Clinical Symptoms: There is dyspnea. ECG Impression: No significant ECG changes with Lexiscan. Comparison with Prior Nuclear Study: No images to compare  Overall Impression: Intermediate risk stress nuclear study with  2 separate defects consistent with signficant sized infarctions  with moderately reduced LVEF.Marland Kitchen    Assessment and Plan:   1. ATRIAL FIBRILLATION: Sinus today. Continue amiodarone and beta blocker. Continue coumadin. CXR/CT chest and TSH ok October 2014. He has eye exams every 3 months.       2. CAD: Recent chest pains. Stress myoview with possible  ischemia. Given recent symptoms, will arrange cardiac cath. Risks and benefits reviewed with pt. He agrees to proceed. Pre-cath labs today. Cath 11/23/13.   3. HTN: BP well controlled. No changes.   4. PAD: Stable. No claudication. He will need to f/u with Dr. Gwenlyn Found in the [facility] clinic.   5. Chest pain: As above, plan cath.

## 2013-11-14 NOTE — Addendum Note (Signed)
Addended by: Lauree Chandler D on: 11/14/2013 03:30 PM   Modules accepted: Orders

## 2013-11-14 NOTE — Patient Instructions (Signed)
Your physician recommends that you schedule a follow-up appointment in:  About 5 weeks.   Your physician recommends that you return for lab work on Nov 21, 2013  Your physician has requested that you have a cardiac catheterization. Cardiac catheterization is used to diagnose and/or treat various heart conditions. Doctors may recommend this procedure for a number of different reasons. The most common reason is to evaluate chest pain. Chest pain can be a symptom of coronary artery disease (CAD), and cardiac catheterization can show whether plaque is narrowing or blocking your heart's arteries. This procedure is also used to evaluate the valves, as well as measure the blood flow and oxygen levels in different parts of your heart. For further information please visit [email or web address]. Please follow instruction sheet, as given. Scheduled for Nov 23, 2013

## 2013-11-17 ENCOUNTER — Encounter (HOSPITAL_COMMUNITY): Payer: Self-pay | Admitting: Pharmacy Technician

## 2013-11-21 ENCOUNTER — Other Ambulatory Visit (INDEPENDENT_AMBULATORY_CARE_PROVIDER_SITE_OTHER): Payer: Medicare Other

## 2013-11-21 DIAGNOSIS — R079 Chest pain, unspecified: Secondary | ICD-10-CM

## 2013-11-21 DIAGNOSIS — I2510 Atherosclerotic heart disease of native coronary artery without angina pectoris: Secondary | ICD-10-CM

## 2013-11-21 LAB — CBC
HCT: 42.7 % (ref 39.0–52.0)
Hemoglobin: 13.9 g/dL (ref 13.0–17.0)
MCHC: 32.5 g/dL (ref 30.0–36.0)
MCV: 83.3 fl (ref 78.0–100.0)
Platelets: 147 10*3/uL — ABNORMAL LOW (ref 150.0–400.0)
RBC: 5.13 Mil/uL (ref 4.22–5.81)
RDW: 14.9 % (ref 11.5–15.5)
WBC: 7.8 10*3/uL (ref 4.0–10.5)

## 2013-11-21 LAB — PROTIME-INR
INR: 1.6 ratio — ABNORMAL HIGH (ref 0.8–1.0)
Prothrombin Time: 17.5 s — ABNORMAL HIGH (ref 9.6–13.1)

## 2013-11-21 LAB — BASIC METABOLIC PANEL
BUN: 23 mg/dL (ref 6–23)
CHLORIDE: 104 meq/L (ref 96–112)
CO2: 29 meq/L (ref 19–32)
Calcium: 9.3 mg/dL (ref 8.4–10.5)
Creatinine, Ser: 1.9 mg/dL — ABNORMAL HIGH (ref 0.4–1.5)
GFR: 44.39 mL/min — AB (ref >60.00)
GLUCOSE: 84 mg/dL (ref 70–99)
Potassium: 3.5 mEq/L (ref 3.5–5.1)
SODIUM: 140 meq/L (ref 135–145)

## 2013-11-23 ENCOUNTER — Ambulatory Visit (HOSPITAL_COMMUNITY)
Admission: RE | Admit: 2013-11-23 | Discharge: 2013-11-24 | Disposition: A | Discharge status: Home / Self Care | Payer: Medicare Other | Source: Ambulatory Visit | Attending: Cardiovascular Disease | Admitting: Cardiovascular Disease

## 2013-11-23 ENCOUNTER — Encounter (HOSPITAL_COMMUNITY)
Admission: RE | Disposition: A | Discharge status: Home / Self Care | Payer: Medicare Other | Source: Ambulatory Visit | Attending: Cardiovascular Disease

## 2013-11-23 DIAGNOSIS — Z7901 Long term (current) use of anticoagulants: Secondary | ICD-10-CM

## 2013-11-23 DIAGNOSIS — I509 Heart failure, unspecified: Secondary | ICD-10-CM | POA: Insufficient documentation

## 2013-11-23 DIAGNOSIS — I5032 Chronic diastolic (congestive) heart failure: Secondary | ICD-10-CM | POA: Diagnosis present

## 2013-11-23 DIAGNOSIS — Z7982 Long term (current) use of aspirin: Secondary | ICD-10-CM | POA: Insufficient documentation

## 2013-11-23 DIAGNOSIS — Z87891 Personal history of nicotine dependence: Secondary | ICD-10-CM | POA: Insufficient documentation

## 2013-11-23 DIAGNOSIS — I2510 Atherosclerotic heart disease of native coronary artery without angina pectoris: Secondary | ICD-10-CM | POA: Diagnosis present

## 2013-11-23 DIAGNOSIS — E785 Hyperlipidemia, unspecified: Secondary | ICD-10-CM | POA: Diagnosis present

## 2013-11-23 DIAGNOSIS — Z8673 Personal history of transient ischemic attack (TIA), and cerebral infarction without residual deficits: Secondary | ICD-10-CM

## 2013-11-23 DIAGNOSIS — I129 Hypertensive chronic kidney disease with stage 1 through stage 4 chronic kidney disease, or unspecified chronic kidney disease: Secondary | ICD-10-CM | POA: Insufficient documentation

## 2013-11-23 DIAGNOSIS — N183 Chronic kidney disease, stage 3 unspecified: Secondary | ICD-10-CM | POA: Diagnosis present

## 2013-11-23 DIAGNOSIS — I10 Essential (primary) hypertension: Secondary | ICD-10-CM | POA: Diagnosis present

## 2013-11-23 DIAGNOSIS — K5730 Diverticulosis of large intestine without perforation or abscess without bleeding: Secondary | ICD-10-CM | POA: Insufficient documentation

## 2013-11-23 DIAGNOSIS — Z9861 Coronary angioplasty status: Secondary | ICD-10-CM

## 2013-11-23 DIAGNOSIS — I4891 Unspecified atrial fibrillation: Secondary | ICD-10-CM | POA: Insufficient documentation

## 2013-11-23 DIAGNOSIS — Z8679 Personal history of other diseases of the circulatory system: Secondary | ICD-10-CM

## 2013-11-23 DIAGNOSIS — I252 Old myocardial infarction: Secondary | ICD-10-CM | POA: Insufficient documentation

## 2013-11-23 DIAGNOSIS — I200 Unstable angina: Secondary | ICD-10-CM | POA: Diagnosis present

## 2013-11-23 DIAGNOSIS — I2589 Other forms of chronic ischemic heart disease: Secondary | ICD-10-CM | POA: Insufficient documentation

## 2013-11-23 HISTORY — PX: LEFT HEART CATHETERIZATION WITH CORONARY ANGIOGRAM: SHX5451

## 2013-11-23 HISTORY — PX: PERCUTANEOUS CORONARY STENT INTERVENTION (PCI-S): SHX5485

## 2013-11-23 LAB — PROTIME-INR
INR: 1.3 (ref 0.00–1.49)
Prothrombin Time: 15.9 seconds — ABNORMAL HIGH (ref 11.6–15.2)

## 2013-11-23 LAB — POCT ACTIVATED CLOTTING TIME: Activated Clotting Time: 420 seconds

## 2013-11-23 SURGERY — LEFT HEART CATHETERIZATION WITH CORONARY ANGIOGRAM
Anesthesia: LOCAL

## 2013-11-23 MED ORDER — ADENOSINE 12 MG/4ML IV SOLN
16.0000 mL | Freq: Once | INTRAVENOUS | Status: DC
  Filled 2013-11-23: qty 16

## 2013-11-23 MED ORDER — NITROGLYCERIN 0.2 MG/ML ON CALL CATH LAB
INTRAVENOUS | Status: AC
  Filled 2013-11-23: qty 1

## 2013-11-23 MED ORDER — HYDRALAZINE HCL 20 MG/ML IJ SOLN
20.0000 mg | Freq: Once | INTRAMUSCULAR | Status: AC
  Administered 2013-11-23: 10 mg via INTRAVENOUS

## 2013-11-23 MED ORDER — SODIUM CHLORIDE 0.9 % IJ SOLN: 3.0000 mL | INTRAMUSCULAR | Status: DC | PRN

## 2013-11-23 MED ORDER — HYDRALAZINE HCL 50 MG PO TABS
50.0000 mg | ORAL_TABLET | Freq: Three times a day (TID) | ORAL | Status: DC
  Administered 2013-11-23 – 2013-11-24 (×2): 50 mg via ORAL
  Filled 2013-11-23 (×4): qty 1

## 2013-11-23 MED ORDER — HYDRALAZINE HCL 20 MG/ML IJ SOLN
INTRAMUSCULAR | Status: AC
  Filled 2013-11-23: qty 1

## 2013-11-23 MED ORDER — DIAZEPAM 5 MG PO TABS
5.0000 mg | ORAL_TABLET | Freq: Once | ORAL | Status: AC
  Administered 2013-11-23: 5 mg via ORAL

## 2013-11-23 MED ORDER — RAMIPRIL 10 MG PO CAPS
10.0000 mg | ORAL_CAPSULE | Freq: Every day | ORAL | Status: DC
  Administered 2013-11-24: 10 mg via ORAL
  Filled 2013-11-23: qty 1

## 2013-11-23 MED ORDER — SIMVASTATIN 10 MG PO TABS
10.0000 mg | ORAL_TABLET | Freq: Every day | ORAL | Status: DC
  Filled 2013-11-23: qty 1

## 2013-11-23 MED ORDER — AMIODARONE HCL 100 MG PO TABS
100.0000 mg | ORAL_TABLET | Freq: Every day | ORAL | Status: DC
  Administered 2013-11-24: 10:00:00 100 mg via ORAL
  Filled 2013-11-23: qty 1

## 2013-11-23 MED ORDER — HEPARIN (PORCINE) IN NACL 2-0.9 UNIT/ML-% IJ SOLN
INTRAMUSCULAR | Status: AC
  Filled 2013-11-23: qty 1500

## 2013-11-23 MED ORDER — METOPROLOL SUCCINATE ER 50 MG PO TB24
50.0000 mg | ORAL_TABLET | Freq: Two times a day (BID) | ORAL | Status: DC
  Administered 2013-11-23 – 2013-11-24 (×2): 50 mg via ORAL
  Filled 2013-11-23 (×3): qty 1

## 2013-11-23 MED ORDER — CLOPIDOGREL BISULFATE 75 MG PO TABS
75.0000 mg | ORAL_TABLET | Freq: Every day | ORAL | Status: DC
  Administered 2013-11-24: 10:00:00 75 mg via ORAL
  Filled 2013-11-23: qty 1

## 2013-11-23 MED ORDER — FENTANYL CITRATE 0.05 MG/ML IJ SOLN
INTRAMUSCULAR | Status: AC
  Filled 2013-11-23: qty 2

## 2013-11-23 MED ORDER — ASPIRIN 81 MG PO CHEW: 81.0000 mg | CHEWABLE_TABLET | ORAL | Status: DC

## 2013-11-23 MED ORDER — DIAZEPAM 5 MG PO TABS
ORAL_TABLET | ORAL | Status: AC
  Filled 2013-11-23: qty 1

## 2013-11-23 MED ORDER — SODIUM CHLORIDE 0.9 % IV SOLN: 250.0000 mL | INTRAVENOUS | Status: DC | PRN

## 2013-11-23 MED ORDER — FUROSEMIDE 40 MG PO TABS
40.0000 mg | ORAL_TABLET | Freq: Every day | ORAL | Status: DC
  Administered 2013-11-24: 10:00:00 40 mg via ORAL
  Filled 2013-11-23 (×2): qty 1

## 2013-11-23 MED ORDER — DIAZEPAM 2 MG PO TABS: 2.0000 mg | ORAL_TABLET | ORAL | Status: DC

## 2013-11-23 MED ORDER — BIVALIRUDIN 250 MG IV SOLR
INTRAVENOUS | Status: AC
Start: 2013-11-23 — End: 2013-11-23
  Filled 2013-11-23: qty 250

## 2013-11-23 MED ORDER — MIDAZOLAM HCL 2 MG/2ML IJ SOLN
INTRAMUSCULAR | Status: AC
  Filled 2013-11-23: qty 2

## 2013-11-23 MED ORDER — OLOPATADINE HCL 0.1 % OP SOLN
1.0000 [drp] | Freq: Two times a day (BID) | OPHTHALMIC | Status: DC
Start: 2013-11-23 — End: 2013-11-24
  Administered 2013-11-23: 22:00:00 1 [drp] via OPHTHALMIC
  Filled 2013-11-23: qty 5

## 2013-11-23 MED ORDER — SODIUM CHLORIDE 0.9 % IV SOLN: INTRAVENOUS | Status: AC

## 2013-11-23 MED ORDER — SODIUM CHLORIDE 0.9 % IJ SOLN: 3.0000 mL | Freq: Two times a day (BID) | INTRAMUSCULAR | Status: DC

## 2013-11-23 MED ORDER — CLOPIDOGREL BISULFATE 300 MG PO TABS
ORAL_TABLET | ORAL | Status: AC
  Filled 2013-11-23: qty 2

## 2013-11-23 MED ORDER — LIDOCAINE HCL (PF) 1 % IJ SOLN
INTRAMUSCULAR | Status: AC
  Filled 2013-11-23: qty 30

## 2013-11-23 MED ORDER — ASPIRIN 81 MG PO CHEW
81.0000 mg | CHEWABLE_TABLET | Freq: Every day | ORAL | Status: DC
  Filled 2013-11-23: qty 1

## 2013-11-23 MED ORDER — SODIUM CHLORIDE 0.9 % IV SOLN
INTRAVENOUS | Status: DC
  Administered 2013-11-23: 10:00:00 via INTRAVENOUS

## 2013-11-23 NOTE — Research (Signed)
[facility] Research Study Informed Consent   Subject Name: [name]  Subject met inclusion and exclusion criteria.  The informed consent form, study requirements and expectations were reviewed with the subject and questions and concerns were addressed prior to the signing of the consent form.  The subject verbalized understanding of the trail requirements.  The subject agreed to participate in the [place]  trial and signed the informed consent.  The informed consent was obtained prior to performance of any protocol-specific procedures for the subject.  A copy of the signed informed consent was given to the subject and a copy was placed in the subject's medical record.  Lamar Laundry Ashleigh Arya 11/23/2013, 12:11 PM

## 2013-11-23 NOTE — H&P (View-Only) (Signed)
  History of Present Illness: 80 yo male with h/o CAD, paroxysmal atrial fibrillation, CVA, diastolic CHF, HTN, HLD, CRI, PAD here today for cardiac follow up. He has been followed in the past by Dr. Brodie. He has had multiple PCI. He also has paroxysmal atrial fibrillation which is treated with amiodarone and Coumadin. He had a previous stroke with left-sided weakness a number of years ago. He walks with a cane. He has a history of diastolic CHF and his last ejection fraction was 50-55% by echo August 2013. ABI October 2014 were hard to obtain but TBI normal. He is supposed to follow with Dr. Berry for his PAD.    He is here today for cardiac follow up. He continues to have exertional chest pain and SOB.     Primary Care Physician: Veita Bland  Last Lipid Profile: Followed in primary care.   Past Medical History  Diagnosis Date  . Hx of adenomatous colonic polyps     hx?  . Diverticulosis of colon   . MI (myocardial infarction)   . Cellulitis     arm  . BPH (benign prostatic hypertrophy)     hx of; s/p TURP  . Venous insufficiency of leg     lower extremity edema; probably an element of diastolic heart failure and this was previously aggrevated by Norvasc  . HLD (hyperlipidemia)   . HTN (hypertension)   . CVA (cerebral vascular accident)     hx  . Atrial fibrillation     embolic stroke secondary to a-fi; paroxysmal a-fib, controlled on amiodarone and coumadin    . CHF (congestive heart failure)   . Ischemic cardiomyopathy   . CAD (coronary artery disease)     s/p remote diaphragmatic wall infarction and multiple percutaneous coronary interventions. EF 50%  . Chronic renal insufficiency     creatinine 1.9     Past Surgical History  Procedure Laterality Date  . Turp  1989  . Angioplasty/stent placement      Current Outpatient Prescriptions  Medication Sig Dispense Refill  . amiodarone (PACERONE) 200 MG tablet TAKE 1/2 TABLET (100MG     TOTAL) DAILY  45 tablet  3  .  aspirin 81 MG EC tablet Take 81 mg by mouth daily.        . BEPREVE 1.5 % SOLN       . furosemide (LASIX) 40 MG tablet TAKE 1 TABLET DAILY  90 tablet  0  . hydrALAZINE (APRESOLINE) 50 MG tablet Take 1 tablet (50 mg total) by mouth 3 (three) times daily.  270 tablet  3  . loratadine (ALLERGY RELIEF) 10 MG dissolvable tablet Take 10 mg by mouth daily.       . magnesium oxide (MAG-OX) 400 MG tablet Take 400 mg by mouth 2 (two) times daily.        . metoprolol succinate (TOPROL-XL) 50 MG 24 hr tablet TAKE 1 TABLET TWICE A DAY  180 tablet  3  . pravastatin (PRAVACHOL) 20 MG tablet TAKE 1 TABLET AT BEDTIME  90 tablet  3  . ramipril (ALTACE) 10 MG capsule TAKE 1 CAPSULE DAILY  90 capsule  0  . warfarin (COUMADIN) 2 MG tablet Take as directed by Anticoagulation clinic 14 tabs ordered until gets refill from mail order pharmacy  14 tablet  0   No current facility-administered medications for this visit.    No Known Allergies  History   Social History  . Marital Status: Widowed    Spouse   Name: N/A    Number of Children: N/A  . Years of Education: N/A   Occupational History  . Not on file.   Social History Main Topics  . Smoking status: Former Research scientist (life sciences)  . Smokeless tobacco: Never Used     Comment: nonsmoker  . Alcohol Use: No  . Drug Use: Not on file  . Sexual Activity: Not on file   Other Topics Concern  . Not on file   Social History Narrative   Worked as a Recruitment consultant.     Family History  Problem Relation Age of Onset  . Coronary artery disease      family hx     Review of Systems:  As stated in the HPI and otherwise negative.   BP 128/68  Pulse 60  Ht 5\' 7"  (1.702 m)  Wt 188 lb (85.276 kg)  BMI 29.44 kg/m2  Physical Examination: General: Well developed, well nourished, NAD HEENT: OP clear, mucus membranes moist SKIN: warm, dry. No rashes. Neuro: No focal deficits Musculoskeletal: Muscle strength 5/5 all ext Psychiatric: Mood and affect normal Neck: No JVD, no  carotid bruits, no thyromegaly, no lymphadenopathy. Lungs:Clear bilaterally, no wheezes, rhonci, crackles Cardiovascular: Regular rate and rhythm. No murmurs, gallops or rubs. Abdomen:Soft. Bowel sounds present. Non-tender.  Extremities: No lower extremity edema. Pulses are 2 + in the bilateral DP/PT.  Echo 03/02/12: The study was technically difficult, as a result of poor sound wave transmission. - Left ventricle: The cavity size was normal. Wall thickness was increased in a pattern of mild LVH. Systolic function was normal. The estimated ejection fraction was in the range of 50% to 55%. - Left atrium: The atrium was moderately dilated.  Stress myoview 11/10/13: Raw Data Images: There is notably increased hepatic tracer  uptake that partially obscures the inferoseptal wall.   Stress Images: There is decreased uptake in the anterior wall. A moderate sized,medium intensity, fixed perfusion defect in the mid to apical anterior-apical wall. There is decreased uptake in the inferior wall. There is decreased uptake in the septum. A  large sized, severe intensity, fixed perfusion defect in the  entire inferior-inferoseptal (basal to apical). There is  decreased uptake in the apex. No Reversibility is seen.  Rest Images: There is decreased uptake in the anterior wall.  There is decreased uptake in the inferior wall. There is  decreased uptake in the septum. Comparison with the stress images reveals no significant change.  LV Wall Motion: Gated images confirm essentially severe  hypokinesis to akinesis of the inferior-inferoseptal wall and at  least moderate anterior hypokinesis -- consistent with prior  infarctions in the LAD & RCA distribution.  Subtraction (SDS): There appear to be 2 separate fixed defects with significant correlating wall motion abnormalities that are  most consistent with a previous infarctions. No reversibility is  appreciated. No evidence of ischemia.    Impression Exercise Capacity: Lexiscan with no exercise. BP Response: Normal blood pressure response. Clinical Symptoms: There is dyspnea. ECG Impression: No significant ECG changes with Lexiscan. Comparison with Prior Nuclear Study: No images to compare  Overall Impression: Intermediate risk stress nuclear study with  2 separate defects consistent with signficant sized infarctions  with moderately reduced LVEF.Marland Kitchen    Assessment and Plan:   1. ATRIAL FIBRILLATION: Sinus today. Continue amiodarone and beta blocker. Continue coumadin. CXR/CT chest and TSH ok October 2014. He has eye exams every 3 months.       2. CAD: Recent chest pains. Stress myoview with possible  ischemia. Given recent symptoms, will arrange cardiac cath. Risks and benefits reviewed with pt. He agrees to proceed. Pre-cath labs today. Cath 11/23/13.   3. HTN: BP well controlled. No changes.   4. PAD: Stable. No claudication. He will need to f/u with Dr. Berry in the [facility] clinic.   5. Chest pain: As above, plan cath.     

## 2013-11-23 NOTE — Interval H&P Note (Signed)
History and Physical Interval Note:  11/23/2013 1:19 PM  [name]  has presented today for cardiac cath with the diagnosis of chest pain and abnormal stress test.   The various methods of treatment have been discussed with the patient and family. After consideration of risks, benefits and other options for treatment, the patient has consented to  Procedure(s): LEFT HEART CATHETERIZATION WITH CORONARY ANGIOGRAM (N/A) as a surgical intervention .  The patient's history has been reviewed, patient examined, no change in status, stable for surgery.  I have reviewed the patient's chart and labs.  Questions were answered to the patient's satisfaction.    Cath Lab Visit (complete for each Cath Lab visit)  Clinical Evaluation Leading to the Procedure:   ACS: no  Non-ACS:    Anginal Classification: CCS III  Anti-ischemic medical therapy: Minimal Therapy (1 class of medications)  Non-Invasive Test Results: Intermediate-risk stress test findings: cardiac mortality 1-3%/year  Prior CABG: No previous CABG       Burnell Blanks

## 2013-11-23 NOTE — CV Procedure (Signed)
     Cardiac Catheterization Operative Report  AUGUSTO DECKMAN [id number] 5/7/20153:01 PM Elyn Peers, MD  Procedure Performed:  1. Left Heart Catheterization 2. Selective Coronary Angiography 3. PTCA/DES x 1 OM1  Operator: Lauree Chandler, MD  Indication:   80 yo male with history of CAD with prior PCI in 2003 now with recent chest pain c/w unstable angina.(class III).  Intermediate risk stress test.                                Procedure Details: The risks, benefits, complications, treatment options, and expected outcomes were discussed with the patient. The patient and/or family concurred with the proposed plan, giving informed consent. The patient was brought to the cath lab after IV hydration was begun and oral premedication was given. The patient was further sedated with Versed and Fentanyl. I gained access in the right radial artery but could not engage the aortic root due to tortuosity. I then obtained access in the right femoral artery. Using the modified Seldinger access technique, a 5 French sheath was placed in the right femoral artery. Standard diagnostic catheters were used to perform selective coronary angiography. A pigtail catheter was used to measure LV pressures. He was found to have severe stenosis in the first OM. I elected to proceed to PCI of the OM.   PCI Note: The left main was engaged with a XB 3.0 guiding catheter. He was given Angiomax weight based bolus followed by a drip. He was given Plavix 600 mg po x 1. I then passed a Cougar IC wire down the first OM branch. A 2.5 x 12 mm balloon was used to pre-dilate the severe stenosis in the first OM. A 2.5 x 12 mm Xience DES was deployed in the mid segment of OM1. The stent was post-dilated with a 2.75 x 8 mm  balloon x 1. The stenosis was taken from 95% down to 0%.   There were no immediate complications. The patient was taken to the recovery area in stable condition.   Hemodynamic Findings: Central aortic  pressure: 85/56 Left ventricular pressure: 96/13/16  Angiographic Findings:  Left main: Mild non-obstructive disease.   Left Anterior Descending Artery: Large caliber vessel that courses to the apex. There is diffuse calcification and serial 30% stenoses in the proximal and mid LAD. There appears to be a stent in the proximal LAD with no restenosis. There is a small to moderate caliber bifurcating diagonal branch with 70% stenosis just prior to the bifurcation, unchanged from last cath.   Circumflex Artery: Large caliber vessel with large bifurcating obtuse marginal branch. The inferior sub-branch of the OM system has a 95% stenosis. The AV groove Circumflex tapers to a small caliber vessel.   Right Coronary Artery: Large dominant vessel with diffuse calcific 30% stenoses in the proximal, mid and distal vessel.   Left Ventricular Angiogram: Deferred.   Impression: 1. Double vessel CAD with patent stent proximal LAD and new severe stenosis OM1 2. Successful PTCA/DES x 1 OM1 3  Unstable angina  Recommendations: He will need Plavix for lifetime. Will stop ASA tomorrow after coumadin is restarted.        Complications:  None. The patient tolerated the procedure well.

## 2013-11-24 ENCOUNTER — Encounter (HOSPITAL_COMMUNITY): Payer: Self-pay | Admitting: *Deleted

## 2013-11-24 DIAGNOSIS — I2510 Atherosclerotic heart disease of native coronary artery without angina pectoris: Secondary | ICD-10-CM

## 2013-11-24 DIAGNOSIS — I200 Unstable angina: Secondary | ICD-10-CM

## 2013-11-24 LAB — BASIC METABOLIC PANEL
BUN: 18 mg/dL (ref 6–23)
CALCIUM: 9 mg/dL (ref 8.4–10.5)
CO2: 25 meq/L (ref 19–32)
CREATININE: 1.42 mg/dL — AB (ref 0.50–1.35)
Chloride: 105 mEq/L (ref 96–112)
GFR calc Af Amer: 51 mL/min — ABNORMAL LOW (ref >90)
GFR calc non Af Amer: 44 mL/min — ABNORMAL LOW (ref >90)
Glucose, Bld: 85 mg/dL (ref 70–99)
Potassium: 4.6 mEq/L (ref 3.7–5.3)
Sodium: 142 mEq/L (ref 137–147)

## 2013-11-24 LAB — CBC
HCT: 42.7 % (ref 39.0–52.0)
Hemoglobin: 13.6 g/dL (ref 13.0–17.0)
MCH: 26.9 pg (ref 26.0–34.0)
MCHC: 31.9 g/dL (ref 30.0–36.0)
MCV: 84.4 fL (ref 78.0–100.0)
PLATELETS: 110 10*3/uL — AB (ref 150–400)
RBC: 5.06 MIL/uL (ref 4.22–5.81)
RDW: 14.3 % (ref 11.5–15.5)
WBC: 6.7 10*3/uL (ref 4.0–10.5)

## 2013-11-24 MED ORDER — CLOPIDOGREL BISULFATE 75 MG PO TABS: 75.0000 mg | ORAL_TABLET | Freq: Every day | ORAL | Status: DC

## 2013-11-24 MED ORDER — WARFARIN SODIUM 2 MG PO TABS
2.0000 mg | ORAL_TABLET | Freq: Once | ORAL | Status: AC
  Administered 2013-11-24: 2 mg via ORAL
  Filled 2013-11-24 (×2): qty 1

## 2013-11-24 MED ORDER — WARFARIN - PHYSICIAN DOSING INPATIENT: Freq: Every day | Status: DC

## 2013-11-24 MED ORDER — NITROGLYCERIN 0.4 MG SL SUBL: 0.4000 mg | SUBLINGUAL_TABLET | SUBLINGUAL | Status: DC | PRN

## 2013-11-24 MED FILL — Sodium Chloride IV Soln 0.9%: INTRAVENOUS | Qty: 50 | Status: AC

## 2013-11-24 NOTE — Discharge Summary (Signed)
See full note this am. cdm 

## 2013-11-24 NOTE — Discharge Summary (Signed)
Patient ID: [name],  MRN: [id number], DOB/AGE: [birth year] 80 y.o.  Admit date: 11/23/2013 Discharge date: 11/24/2013  Primary Care Provider: Dr Johnsie Kindred Primary Cardiologist: Dr McAlhaney/Dr Adora Fridge  Discharge Diagnoses Principal Problem:   Unstable angina Active Problems:   S/P PTCA and OM1 DES 11/23/13   CAD- prior LAD stent 2003   Chronic renal disease, stage III   Long term current use of anticoagulant   HYPERLIPIDEMIA   HYPERTENSION   Chronic diastolic heart failure   History of CVA (cerebrovascular accident)   ATRIAL FIBRILLATION, HX OF    Procedures: Cath, OM1 PTCA/DES 11/23/13   Hospital Course:  80 yo male with h/o CAD, paroxysmal atrial fibrillation, CVA, diastolic CHF, HTN, HLD, CRI, and PAD. He saw Dr Julianne Handler as an OP with complaints of SSCP and DOE. Myoview 11/10/13 was interpreted as an ntermediate risk stress nuclear study with 2 separate defects consistent with signficant sized infarctions with moderately reduced LVEF. He was admitted for cath 11/23/13. This reveled an inferior sub-branch of the OM system had a 95% stenosis. This was treated with a successful PTCA/DES x 1 OM1. The LAD was a large caliber vessel that courses to the apex. There was diffuse calcification and serial 30% stenoses in the proximal and mid LAD. There appeared to be a stent in the proximal LAD with no restenosis. There was a small to moderate caliber bifurcating diagonal branch with 70% stenosis just prior to the bifurcation, unchanged from last cath. RCA was a large, dominant vessel with a calcific, proximal stenosis. DR Julianne Handler feels he will need lifetime Plavix and has stopped his aspirin. He will resume Coumadin at discharge. He has an appointment with Dr Gwenlyn Found 11/28/13 and this can be his post hospital check as well. I've asked our pharmacist to check an INR that day to save him a trip.        Discharge Vitals:  Blood pressure 144/57, pulse 52, temperature 98 F (36.7 C),  temperature source Oral, resp. rate 17, height $RemoveBe'5\' 7"'iiElYPbSm$  (1.702 m), weight 188 lb 15 oz (85.7 kg), SpO2 98.00%.    Labs: Results for orders placed during the hospital encounter of 11/23/13 (from the past 48 hour(s))  PROTIME-INR     Status: Abnormal   Collection Time    11/23/13 10:40 AM      Result Value Ref Range   Prothrombin Time 15.9 (*) 11.6 - 15.2 seconds   INR 1.30  0.00 - 1.49  POCT ACTIVATED CLOTTING TIME     Status: None   Collection Time    11/23/13  2:33 PM      Result Value Ref Range   Activated Clotting Time 349    BASIC METABOLIC PANEL     Status: Abnormal   Collection Time    11/24/13  8:17 AM      Result Value Ref Range   Sodium 142  137 - 147 mEq/L   Potassium 4.6  3.7 - 5.3 mEq/L   Chloride 105  96 - 112 mEq/L   CO2 25  19 - 32 mEq/L   Glucose, Bld 85  70 - 99 mg/dL   BUN 18  6 - 23 mg/dL   Creatinine, Ser 1.42 (*) 0.50 - 1.35 mg/dL   Calcium 9.0  8.4 - 10.5 mg/dL   GFR calc non Af Amer 44 (*) >90 mL/min   GFR calc Af Amer 51 (*) >90 mL/min   Comment: (NOTE)     The eGFR  has been calculated using the CKD EPI equation.     This calculation has not been validated in all clinical situations.     eGFR's persistently <90 mL/min signify possible Chronic Kidney     Disease.  CBC     Status: None   Collection Time    11/24/13  8:17 AM      Result Value Ref Range   WBC 6.7  4.0 - 10.5 K/uL   RBC 5.06  4.22 - 5.81 MIL/uL   Hemoglobin 13.6  13.0 - 17.0 g/dL   HCT 42.7  39.0 - 52.0 %   MCV 84.4  78.0 - 100.0 fL   MCH 26.9  26.0 - 34.0 pg   MCHC 31.9  30.0 - 36.0 g/dL   RDW 14.3  11.5 - 15.5 %   Platelets PENDING  150 - 400 K/uL    Disposition:      Follow-up Information   Follow up with Lorretta Harp, MD On 11/28/2013. (2:15. )    Specialty:  Cardiology   Contact information:   [address] [place] [address] [phone]       Discharge Medications:    Medication List    STOP taking these medications       aspirin 81 MG EC  tablet      TAKE these medications       ALLERGY RELIEF 10 MG dissolvable tablet  Generic drug:  loratadine  Take 10 mg by mouth daily as needed.     amiodarone 200 MG tablet  Commonly known as:  PACERONE  Take 100 mg by mouth daily.     BEPREVE 1.5 % Soln  Generic drug:  Bepotastine Besilate  Place 1 drop into both eyes 2 (two) times daily.     clopidogrel 75 MG tablet  Commonly known as:  PLAVIX  Take 1 tablet (75 mg total) by mouth daily with breakfast.     furosemide 40 MG tablet  Commonly known as:  LASIX  Take 40 mg by mouth daily.     hydrALAZINE 50 MG tablet  Commonly known as:  APRESOLINE  Take 50 mg by mouth 3 (three) times daily.     magnesium oxide 400 MG tablet  Commonly known as:  MAG-OX  Take 400 mg by mouth 2 (two) times daily.     metoprolol succinate 50 MG 24 hr tablet  Commonly known as:  TOPROL-XL  Take 50 mg by mouth 2 (two) times daily. Take with or immediately following a meal.     nitroGLYCERIN 0.4 MG SL tablet  Commonly known as:  NITROSTAT  Place 1 tablet (0.4 mg total) under the tongue every 5 (five) minutes as needed for chest pain.     pravastatin 20 MG tablet  Commonly known as:  PRAVACHOL  Take 20 mg by mouth at bedtime.     ramipril 10 MG capsule  Commonly known as:  ALTACE  Take 10 mg by mouth daily.     warfarin 2 MG tablet  Commonly known as:  COUMADIN  Take 2-3 mg by mouth daily. Take 2 mg on Monday, and Thursday.  Take 1 1/2 tablet (3 mg ) on all other days.         Duration of Discharge Encounter: Greater than 30 minutes including physician time.  Angelena Form PA-C 11/24/2013 9:53 AM

## 2013-11-24 NOTE — Progress Notes (Signed)
     SUBJECTIVE: No chest pain or SOB   BP 148/60  Pulse 58  Temp(Src) 98.3 F (36.8 C) (Oral)  Resp 17  Ht 5\' 7"  (1.702 m)  Wt 188 lb 15 oz (85.7 kg)  BMI 29.58 kg/m2  SpO2 96%  Intake/Output Summary (Last 24 hours) at 11/24/13 0641 Last data filed at 11/24/13 0439  Gross per 24 hour  Intake 393.75 ml  Output    400 ml  Net  -6.25 ml    PHYSICAL EXAM General: Well developed, well nourished, in no acute distress. Alert and oriented x 3.  Psych:  Good affect, responds appropriately Neck: No JVD. No masses noted.  Lungs: Clear bilaterally with no wheezes or rhonci noted.  Heart: RRR with no murmurs noted. Abdomen: Bowel sounds are present. Soft, non-tender.  Extremities: No lower extremity edema.   LABS: Basic Metabolic Panel:  Recent Labs  11/21/13 1518  NA 140  K 3.5  CL 104  CO2 29  GLUCOSE 84  BUN 23  CREATININE 1.9*  CALCIUM 9.3   CBC:  Recent Labs  11/21/13 1518  WBC 7.8  HGB 13.9  HCT 42.7  MCV 83.3  PLT 147.0*    Current Meds: . amiodarone  100 mg Oral Daily  . aspirin  81 mg Oral Daily  . clopidogrel  75 mg Oral Q breakfast  . furosemide  40 mg Oral Daily  . hydrALAZINE  50 mg Oral TID  . metoprolol succinate  50 mg Oral BID  . olopatadine  1 drop Both Eyes BID  . ramipril  10 mg Oral Daily  . simvastatin  10 mg Oral q1800     ASSESSMENT AND PLAN:  1. CAD/Unstable angina: Pt admitted after cardiac cath yesterday. Severe stenosis OM1. Now s/p DES x 1 OM1. He can be discharged home today if BMET and CBC are stable.  Baseline creatinine is around 1.7-2.0. Will resume coumadin today. Will stop ASA and continue Plavix 75 mg once daily. Follow up coumadin clinic next week for INR. Follow up with office NP/PA in 1-2 weeks.    Burnell Blanks  5/8/20156:41 AM

## 2013-11-27 ENCOUNTER — Encounter: Payer: Self-pay | Admitting: Podiatry

## 2013-11-27 ENCOUNTER — Ambulatory Visit (INDEPENDENT_AMBULATORY_CARE_PROVIDER_SITE_OTHER): Payer: Medicare Other | Admitting: Podiatry

## 2013-11-27 VITALS — BP 126/59 | HR 67 | Resp 18

## 2013-11-27 DIAGNOSIS — B351 Tinea unguium: Secondary | ICD-10-CM

## 2013-11-27 DIAGNOSIS — M79609 Pain in unspecified limb: Secondary | ICD-10-CM

## 2013-11-27 NOTE — Progress Notes (Signed)
Subjective:     Patient ID: [name], male   DOB: 06/27/1927, 80 y.o.   MRN: [id number]  HPI patient presents with thick nailbeds 1-5 both feet that he cannot cut himself   Review of Systems     Objective:   Physical Exam Neurovascular unchanged with thick brittle nailbeds 1-5 both feet that are painful when pressed    Assessment:     Mycotic nail infection with pain of nailbeds 1-5 both feet    Plan:     Debridement painful nailbeds 1-5 both feet with no iatrogenic bleeding noted

## 2013-11-27 NOTE — Progress Notes (Signed)
   Subjective:    Patient ID: [name], male    DOB: Dec 14, 1926, 80 y.o.   MRN: [id number]  HPI  I am here to get my toenails trimmed     Review of Systems     Objective:   Physical Exam        Assessment & Plan:

## 2013-11-28 ENCOUNTER — Ambulatory Visit (INDEPENDENT_AMBULATORY_CARE_PROVIDER_SITE_OTHER): Payer: Medicare Other | Admitting: Cardiovascular Disease

## 2013-11-28 ENCOUNTER — Ambulatory Visit (INDEPENDENT_AMBULATORY_CARE_PROVIDER_SITE_OTHER): Payer: Medicare Other | Admitting: *Deleted

## 2013-11-28 ENCOUNTER — Encounter: Payer: Self-pay | Admitting: Cardiovascular Disease

## 2013-11-28 VITALS — BP 137/69 | HR 60 | Ht 67.0 in | Wt 188.8 lb

## 2013-11-28 DIAGNOSIS — I6350 Cerebral infarction due to unspecified occlusion or stenosis of unspecified cerebral artery: Secondary | ICD-10-CM

## 2013-11-28 DIAGNOSIS — Z8673 Personal history of transient ischemic attack (TIA), and cerebral infarction without residual deficits: Secondary | ICD-10-CM

## 2013-11-28 DIAGNOSIS — Z7901 Long term (current) use of anticoagulants: Secondary | ICD-10-CM

## 2013-11-28 DIAGNOSIS — I4891 Unspecified atrial fibrillation: Secondary | ICD-10-CM

## 2013-11-28 DIAGNOSIS — I739 Peripheral vascular disease, unspecified: Secondary | ICD-10-CM

## 2013-11-28 LAB — POCT INR: INR: 1.2

## 2013-11-28 NOTE — Patient Instructions (Signed)
Follow up with Dr Gwenlyn Found as needed.   Dr Gwenlyn Found has ordered a lower extremity arterial doppler- During this test, ultrasound is used to evaluate arterial blood flow in the legs. Allow approximately one hour for this exam.

## 2013-11-28 NOTE — Progress Notes (Signed)
11/28/2013 [name]   1926/12/09  [id number]  Primary Physician Elyn Peers, MD Primary Cardiologist: Lorretta Harp MD Renae Gloss   HPI:  [name] is an [name], grandfather and 5 grandchildren, retired Development worker, community carrier who is referred for peripheral vascular evaluation. His cardiologist is Dr. Carolanne Grumbling.  primary care physician is Dr. Lucianne Lei. He has a long history of vascular disease having had interventions 5 times in the past. Most recently he underwent cardiac catheterization by Dr. Dr. Carolanne Grumbling. 11/23/13 with PTCA of OM1. History otherwise is remarkable for paroxysmal atrial fib having had a stroke back in 06 on Coumadin anticoagulation. He has hypertension and hyperlipidemia. He smoked remotely. He said to microinfarctions in the past. Her father did die of a myocardial infarction a 78. He complains of some numbness in his feet consistent with neuropathy and some mild calf claudication. [facility]14 which were unremarkable.   Current Outpatient Prescriptions  Medication Sig Dispense Refill  . amiodarone (PACERONE) 200 MG tablet Take 100 mg by mouth daily.      Marland Kitchen BEPREVE 1.5 % SOLN Place 1 drop into both eyes 2 (two) times daily.       . clopidogrel (PLAVIX) 75 MG tablet Take 1 tablet (75 mg total) by mouth daily with breakfast.  30 tablet  11  . furosemide (LASIX) 40 MG tablet Take 40 mg by mouth daily.      . hydrALAZINE (APRESOLINE) 50 MG tablet Take 50 mg by mouth 3 (three) times daily.      Marland Kitchen loratadine (ALLERGY RELIEF) 10 MG dissolvable tablet Take 10 mg by mouth daily as needed.       . magnesium oxide (MAG-OX) 400 MG tablet Take 400 mg by mouth 2 (two) times daily.       . metoprolol succinate (TOPROL-XL) 50 MG 24 hr tablet Take 50 mg by mouth 2 (two) times daily. Take with or immediately following a meal.      . nitroGLYCERIN  (NITROSTAT) 0.4 MG SL tablet Place 1 tablet (0.4 mg total) under the tongue every 5 (five) minutes as needed for chest pain.  25 tablet  2  . pravastatin (PRAVACHOL) 20 MG tablet Take 20 mg by mouth at bedtime.      . ramipril (ALTACE) 10 MG capsule Take 10 mg by mouth daily.      Marland Kitchen warfarin (COUMADIN) 2 MG tablet Take 2-3 mg by mouth daily. Take 2 mg on Monday, and Thursday.  Take 1 1/2 tablet (3 mg ) on all other days.       No current facility-administered medications for this visit.    No Known Allergies  History   Social History  . Marital Status: Widowed    Spouse Name: N/A    Number of Children: N/A  . Years of Education: N/A   Occupational History  . Not on file.   Social History Main Topics  . Smoking status: Former Research scientist (life sciences)  . Smokeless tobacco: Never Used     Comment: nonsmoker  . Alcohol Use: No  . Drug Use: Not on file  . Sexual Activity: Not on file   Other Topics Concern  . Not on file   Social History Narrative   Worked as a Recruitment consultant.      Review of Systems: General: negative for chills, fever, night sweats or weight changes.  Cardiovascular:  negative for chest pain, dyspnea on exertion, edema, orthopnea, palpitations, paroxysmal nocturnal dyspnea or shortness of breath Dermatological: negative for rash Respiratory: negative for cough or wheezing Urologic: negative for hematuria Abdominal: negative for nausea, vomiting, diarrhea, bright red blood per rectum, melena, or hematemesis Neurologic: negative for visual changes, syncope, or dizziness All other systems reviewed and are otherwise negative except as noted above.    Blood pressure 137/69, pulse 60, height 5\' 7"  (1.702 m), weight 188 lb 12.8 oz (85.639 kg).  General appearance: alert and no distress Neck: no adenopathy, no carotid bruit, no JVD, supple, symmetrical, trachea midline and thyroid not enlarged, symmetric, no tenderness/mass/nodules Lungs: clear to auscultation bilaterally Heart:  regular rate and rhythm, S1, S2 normal, no murmur, click, rub or gallop Extremities: extremities normal, atraumatic, no cyanosis or edema and 2+ pedal pulses bilaterally  EKG not performed today  ASSESSMENT AND PLAN:   Claudication The patient was referred by Dr. Marius Ditch  for peripheral vascular evaluation. He has a history of CAD status post multiple interventions in the past. He complains of symptoms compatible with peripheral neuropathy as well as mild claudication. He had arterial Doppler studies performed at the [facility] office which showed normal TBI is with calcified tibial vessels. On exam today he palpable pedal pulses. His symptoms are really benign and not lifestyle limiting. I am going to get low actually arterial duplex to further evaluate his peripheral vasculature.      Lorretta Harp MD FACP,FACC,FAHA, [facility] 11/28/2013 2:47 PM

## 2013-11-28 NOTE — Assessment & Plan Note (Signed)
The patient was referred by Dr. Marius Ditch  for peripheral vascular evaluation. He has a history of CAD status post multiple interventions in the past. He complains of symptoms compatible with peripheral neuropathy as well as mild claudication. He had arterial Doppler studies performed at the [facility] office which showed normal TBI is with calcified tibial vessels. On exam today he palpable pedal pulses. His symptoms are really benign and not lifestyle limiting. I am going to get low actually arterial duplex to further evaluate his peripheral vasculature.

## 2013-11-30 ENCOUNTER — Ambulatory Visit: Payer: Medicare Other | Admitting: Podiatry

## 2013-12-06 ENCOUNTER — Ambulatory Visit (HOSPITAL_COMMUNITY)
Admission: RE | Admit: 2013-12-06 | Discharge: 2013-12-06 | Disposition: A | Discharge status: Home / Self Care | Payer: Medicare Other | Source: Ambulatory Visit | Attending: Cardiology | Admitting: Cardiology

## 2013-12-06 DIAGNOSIS — I739 Peripheral vascular disease, unspecified: Secondary | ICD-10-CM | POA: Insufficient documentation

## 2013-12-06 NOTE — Progress Notes (Signed)
Lower Extremity Arterial Duplex Completed. °Brianna L Mazza,RVT °

## 2013-12-07 ENCOUNTER — Ambulatory Visit (INDEPENDENT_AMBULATORY_CARE_PROVIDER_SITE_OTHER): Payer: Medicare Other | Admitting: *Deleted

## 2013-12-07 DIAGNOSIS — I6350 Cerebral infarction due to unspecified occlusion or stenosis of unspecified cerebral artery: Secondary | ICD-10-CM

## 2013-12-07 DIAGNOSIS — Z7901 Long term (current) use of anticoagulants: Secondary | ICD-10-CM

## 2013-12-07 DIAGNOSIS — Z8673 Personal history of transient ischemic attack (TIA), and cerebral infarction without residual deficits: Secondary | ICD-10-CM

## 2013-12-07 DIAGNOSIS — I4891 Unspecified atrial fibrillation: Secondary | ICD-10-CM

## 2013-12-07 LAB — POCT INR: INR: 1.9

## 2013-12-22 ENCOUNTER — Encounter: Payer: Self-pay | Admitting: Cardiovascular Disease

## 2013-12-22 ENCOUNTER — Ambulatory Visit (INDEPENDENT_AMBULATORY_CARE_PROVIDER_SITE_OTHER): Payer: Medicare Other

## 2013-12-22 ENCOUNTER — Ambulatory Visit (INDEPENDENT_AMBULATORY_CARE_PROVIDER_SITE_OTHER): Payer: Medicare Other | Admitting: Cardiovascular Disease

## 2013-12-22 VITALS — BP 142/81 | HR 65 | Ht 67.0 in | Wt 188.0 lb

## 2013-12-22 DIAGNOSIS — I6350 Cerebral infarction due to unspecified occlusion or stenosis of unspecified cerebral artery: Secondary | ICD-10-CM

## 2013-12-22 DIAGNOSIS — I2510 Atherosclerotic heart disease of native coronary artery without angina pectoris: Secondary | ICD-10-CM

## 2013-12-22 DIAGNOSIS — I4891 Unspecified atrial fibrillation: Secondary | ICD-10-CM

## 2013-12-22 DIAGNOSIS — I255 Ischemic cardiomyopathy: Secondary | ICD-10-CM

## 2013-12-22 DIAGNOSIS — Z8673 Personal history of transient ischemic attack (TIA), and cerebral infarction without residual deficits: Secondary | ICD-10-CM

## 2013-12-22 DIAGNOSIS — I2589 Other forms of chronic ischemic heart disease: Secondary | ICD-10-CM

## 2013-12-22 DIAGNOSIS — Z7901 Long term (current) use of anticoagulants: Secondary | ICD-10-CM

## 2013-12-22 DIAGNOSIS — I739 Peripheral vascular disease, unspecified: Secondary | ICD-10-CM

## 2013-12-22 DIAGNOSIS — I10 Essential (primary) hypertension: Secondary | ICD-10-CM

## 2013-12-22 LAB — POCT INR: INR: 2.5

## 2013-12-22 NOTE — Patient Instructions (Signed)
Your physician wants you to follow-up in:  6 months. You will receive a reminder letter in the mail two months in advance. If you don't receive a letter, please call our office to schedule the follow-up appointment.   

## 2013-12-22 NOTE — Progress Notes (Signed)
History of Present Illness: 80 yo male with h/o CAD, paroxysmal atrial fibrillation, CVA, diastolic CHF, HTN, HLD, CRI, PAD here today for cardiac follow up. He has been followed in the past by Dr. Olevia Perches. He has had multiple PCI. He also has paroxysmal atrial fibrillation which is treated with amiodarone and Coumadin. He had a previous stroke with left-sided weakness a number of years ago. He walks with a cane. He has a history of diastolic CHF and his last ejection fraction was 50-55% by echo August 2013. ABI October 2014 were hard to obtain but TBI normal. PAD is followed by Dr. Gwenlyn Found. I saw him April 2015 and he had c/o chest pain. Stress myoview with ischemia. LVEF=39%. Cardiac cath 11/23/13 with severe stenosis OM1 treated with 2.5 x 12 mm Xience DES.    He is here today for cardiac follow up.   No chest pain or SOB. Feeling well. No leg pain.   Primary Care Physician: Lucianne Lei  Last Lipid Profile: Followed in primary care.   Past Medical History  Diagnosis Date  . Hx of adenomatous colonic polyps     hx?  . Diverticulosis of colon   . MI (myocardial infarction)   . Cellulitis     arm  . BPH (benign prostatic hypertrophy)     hx of; s/p TURP  . Venous insufficiency of leg     lower extremity edema; probably an element of diastolic heart failure and this was previously aggrevated by Norvasc  . HLD (hyperlipidemia)   . HTN (hypertension)   . CVA (cerebral vascular accident)     hx  . Atrial fibrillation     embolic stroke secondary to a-fi; paroxysmal a-fib, controlled on amiodarone and coumadin    . CHF (congestive heart failure)   . Ischemic cardiomyopathy   . CAD (coronary artery disease)     s/p remote diaphragmatic wall infarction and multiple percutaneous coronary interventions. EF 50%  . Chronic renal insufficiency     creatinine 1.9   . Claudication     Past Surgical History  Procedure Laterality Date  . Turp  1989  . Angioplasty/stent placement    . Lower  extremity arterial doppler  12/01/2011    Bilateral ABIs-not accessible due to calcified vessels. Distal Abdominal Aorta-demonstrated aneurysmal dilatation with a measurement of 2.87x3.32cm. Right runoff-anterior tibial artery into dorsalis pedis appears to demonstrate occlusive disease. Left SFA demonstrates 0-49%. Left runoff-anterior tibial artery demonstrates occlusive disease    Current Outpatient Prescriptions  Medication Sig Dispense Refill  . amiodarone (PACERONE) 200 MG tablet Take 100 mg by mouth daily.      Marland Kitchen BEPREVE 1.5 % SOLN Place 1 drop into both eyes 2 (two) times daily.       . clopidogrel (PLAVIX) 75 MG tablet Take 1 tablet (75 mg total) by mouth daily with breakfast.  30 tablet  11  . furosemide (LASIX) 40 MG tablet Take 40 mg by mouth daily.      . hydrALAZINE (APRESOLINE) 50 MG tablet Take 50 mg by mouth 3 (three) times daily.      Marland Kitchen loratadine (ALLERGY RELIEF) 10 MG dissolvable tablet Take 10 mg by mouth daily as needed.       . magnesium oxide (MAG-OX) 400 MG tablet Take 400 mg by mouth 2 (two) times daily.       . metoprolol succinate (TOPROL-XL) 50 MG 24 hr tablet Take 50 mg by mouth 2 (two) times daily. Take with or  immediately following a meal.      . nitroGLYCERIN (NITROSTAT) 0.4 MG SL tablet Place 1 tablet (0.4 mg total) under the tongue every 5 (five) minutes as needed for chest pain.  25 tablet  2  . pravastatin (PRAVACHOL) 20 MG tablet Take 20 mg by mouth at bedtime.      . ramipril (ALTACE) 10 MG capsule Take 10 mg by mouth daily.      Marland Kitchen warfarin (COUMADIN) 2 MG tablet Take 2-3 mg by mouth daily. Take 2 mg on Monday, and Thursday.  Take 1 1/2 tablet (3 mg ) on all other days.       No current facility-administered medications for this visit.    No Known Allergies  History   Social History  . Marital Status: Widowed    Spouse Name: N/A    Number of Children: N/A  . Years of Education: N/A   Occupational History  . Not on file.   Social History Main  Topics  . Smoking status: Former Research scientist (life sciences)  . Smokeless tobacco: Never Used     Comment: nonsmoker  . Alcohol Use: No  . Drug Use: Not on file  . Sexual Activity: Not on file   Other Topics Concern  . Not on file   Social History Narrative   Worked as a Recruitment consultant.     Family History  Problem Relation Age of Onset  . Coronary artery disease      family hx     Review of Systems:  As stated in the HPI and otherwise negative.   BP 142/81  Pulse 65  Ht 5\' 7"  (1.702 m)  Wt 188 lb (85.276 kg)  BMI 29.44 kg/m2  Physical Examination: General: Well developed, well nourished, NAD HEENT: OP clear, mucus membranes moist SKIN: warm, dry. No rashes. Neuro: No focal deficits Musculoskeletal: Muscle strength 5/5 all ext Psychiatric: Mood and affect normal Neck: No JVD, no carotid bruits, no thyromegaly, no lymphadenopathy. Lungs:Clear bilaterally, no wheezes, rhonci, crackles Cardiovascular: Regular rate and rhythm. No murmurs, gallops or rubs. Abdomen:Soft. Bowel sounds present. Non-tender.  Extremities: No lower extremity edema. Pulses are 2 + in the bilateral DP/PT.  Echo 03/02/12: The study was technically difficult, as a result of poor sound wave transmission. - Left ventricle: The cavity size was normal. Wall thickness was increased in a pattern of mild LVH. Systolic function was normal. The estimated ejection fraction was in the range of 50% to 55%. - Left atrium: The atrium was moderately dilated.  Stress myoview 11/10/13: Raw Data Images: There is notably increased hepatic tracer  uptake that partially obscures the inferoseptal wall.   Stress Images: There is decreased uptake in the anterior wall. A moderate sized,medium intensity, fixed perfusion defect in the mid to apical anterior-apical wall. There is decreased uptake in the inferior wall. There is decreased uptake in the septum. A  large sized, severe intensity, fixed perfusion defect in the  entire  inferior-inferoseptal (basal to apical). There is  decreased uptake in the apex. No Reversibility is seen.  Rest Images: There is decreased uptake in the anterior wall.  There is decreased uptake in the inferior wall. There is  decreased uptake in the septum. Comparison with the stress images reveals no significant change.  LV Wall Motion: Gated images confirm essentially severe  hypokinesis to akinesis of the inferior-inferoseptal wall and at  least moderate anterior hypokinesis -- consistent with prior  infarctions in the LAD & RCA distribution.  Subtraction (SDS): There  appear to be 2 separate fixed defects with significant correlating wall motion abnormalities that are  most consistent with a previous infarctions. No reversibility is  appreciated. No evidence of ischemia.   Impression Exercise Capacity: Lexiscan with no exercise. BP Response: Normal blood pressure response. Clinical Symptoms: There is dyspnea. ECG Impression: No significant ECG changes with Lexiscan. Comparison with Prior Nuclear Study: No images to compare  Overall Impression: Intermediate risk stress nuclear study with  2 separate defects consistent with signficant sized infarctions  with moderately reduced LVEF..  Cardiac cath 11/23/13:  Left main: Mild non-obstructive disease.  Left Anterior Descending Artery: Large caliber vessel that courses to the apex. There is diffuse calcification and serial 30% stenoses in the proximal and mid LAD. There appears to be a stent in the proximal LAD with no restenosis. There is a small to moderate caliber bifurcating diagonal branch with 70% stenosis just prior to the bifurcation, unchanged from last cath.  Circumflex Artery: Large caliber vessel with large bifurcating obtuse marginal branch. The inferior sub-branch of the OM system has a 95% stenosis. The AV groove Circumflex tapers to a small caliber vessel.  Right Coronary Artery: Large dominant vessel with diffuse  calcific 30% stenoses in the proximal, mid and distal vessel.   Assessment and Plan:   1. ATRIAL FIBRILLATION: Sinus today. Continue amiodarone and beta blocker. Continue coumadin. CXR/CT chest and TSH ok October 2014. He has eye exams every 3 months.       2. CAD: Stable. Recent PCI/DES OM1 11/23/13. Continue Plavix. No ASA since he is on coumadin.   3. HTN: BP well controlled. No changes.   4. PAD: Stable. No claudication. Followed by Dr. Gwenlyn Found  5. Ischemic cardiomyopathy: LVEF=39% by nuclear stress test. Continue medical management. May see some improvement post revascularization.

## 2014-01-11 ENCOUNTER — Ambulatory Visit (INDEPENDENT_AMBULATORY_CARE_PROVIDER_SITE_OTHER): Payer: Medicare Other

## 2014-01-11 DIAGNOSIS — I4891 Unspecified atrial fibrillation: Secondary | ICD-10-CM

## 2014-01-11 DIAGNOSIS — Z8673 Personal history of transient ischemic attack (TIA), and cerebral infarction without residual deficits: Secondary | ICD-10-CM

## 2014-01-11 DIAGNOSIS — I6350 Cerebral infarction due to unspecified occlusion or stenosis of unspecified cerebral artery: Secondary | ICD-10-CM

## 2014-01-11 DIAGNOSIS — Z7901 Long term (current) use of anticoagulants: Secondary | ICD-10-CM

## 2014-01-11 LAB — POCT INR: INR: 2.9

## 2014-01-18 ENCOUNTER — Telehealth: Payer: Self-pay | Admitting: Cardiovascular Disease

## 2014-01-18 NOTE — Telephone Encounter (Signed)
New message  Jinny Blossom with [facility] in [place] [phone] Ref Dr. Dierdre Harness (whom is in office tuesdays and thursdays) wants to extract 9 teeth.. requesting clearance How many days will he need to come off of coumdain surgery schefuled for 02/08/2014

## 2014-02-02 NOTE — Telephone Encounter (Signed)
Follow up     Nocatee with [facility] never received a return call regarding clearance for pt to have teeth extracted and to stop coumadin.  Please call today.

## 2014-02-02 NOTE — Telephone Encounter (Signed)
Dentist had previously requested (July 2nd) cardiac clearance for teeth extraction (multiple) and to know how long patient needed to be off Coumadin prior to procedure. States patient is scheduled for 7/23 but they have not heard anything.  Please advise ASAP. They will reschedule him for 7/30 in all probability. However, they still need the cardiac clearance and directive regarding HOLD of Coumadin.  Patient has Coumadin Clinic visit scheduled for 7/23.  Please call Megan at Dentist office ASAP [phone]. Routed to Dr. Angelena Form and Enis Slipper, [place].

## 2014-02-02 NOTE — Telephone Encounter (Signed)
No answer/no voicemail 7/17 @ 11:25/pde

## 2014-02-05 NOTE — Telephone Encounter (Signed)
[name] from [facility] is  aware of MD's recommendations. MD's note faxed to [phone].

## 2014-02-05 NOTE — Telephone Encounter (Signed)
Pt had a drug eluting coronary stent placed May 2015. He cannot come off of Plavix until May 2016. I would not advise dental extraction unless they will do on Plavix. He can hold his coumadin but not his Plavix. [name]

## 2014-02-08 ENCOUNTER — Ambulatory Visit (INDEPENDENT_AMBULATORY_CARE_PROVIDER_SITE_OTHER): Payer: Medicare Other | Admitting: *Deleted

## 2014-02-08 DIAGNOSIS — I4891 Unspecified atrial fibrillation: Secondary | ICD-10-CM

## 2014-02-08 DIAGNOSIS — I6350 Cerebral infarction due to unspecified occlusion or stenosis of unspecified cerebral artery: Secondary | ICD-10-CM

## 2014-02-08 DIAGNOSIS — Z7901 Long term (current) use of anticoagulants: Secondary | ICD-10-CM

## 2014-02-08 DIAGNOSIS — Z8673 Personal history of transient ischemic attack (TIA), and cerebral infarction without residual deficits: Secondary | ICD-10-CM

## 2014-02-08 LAB — POCT INR: INR: 2.8

## 2014-02-08 MED ORDER — WARFARIN SODIUM 2 MG PO TABS: 2.0000 mg | ORAL_TABLET | ORAL | Status: DC

## 2014-02-08 NOTE — Patient Instructions (Signed)
Call us if you get an antibiotic at # [phone]- Coumadin Clinic or Main # [phone].

## 2014-02-21 ENCOUNTER — Other Ambulatory Visit: Payer: Self-pay | Admitting: Cardiovascular Disease

## 2014-02-23 ENCOUNTER — Ambulatory Visit (INDEPENDENT_AMBULATORY_CARE_PROVIDER_SITE_OTHER): Payer: Medicare Other | Admitting: Pharmacist

## 2014-02-23 DIAGNOSIS — I6350 Cerebral infarction due to unspecified occlusion or stenosis of unspecified cerebral artery: Secondary | ICD-10-CM

## 2014-02-23 DIAGNOSIS — Z7901 Long term (current) use of anticoagulants: Secondary | ICD-10-CM

## 2014-02-23 DIAGNOSIS — Z8673 Personal history of transient ischemic attack (TIA), and cerebral infarction without residual deficits: Secondary | ICD-10-CM

## 2014-02-23 DIAGNOSIS — I4891 Unspecified atrial fibrillation: Secondary | ICD-10-CM

## 2014-02-23 LAB — POCT INR: INR: 1.7

## 2014-02-26 ENCOUNTER — Ambulatory Visit (INDEPENDENT_AMBULATORY_CARE_PROVIDER_SITE_OTHER): Payer: Medicare Other | Admitting: Podiatry

## 2014-02-26 DIAGNOSIS — M79673 Pain in unspecified foot: Secondary | ICD-10-CM

## 2014-02-26 DIAGNOSIS — M79609 Pain in unspecified limb: Secondary | ICD-10-CM

## 2014-02-26 DIAGNOSIS — B351 Tinea unguium: Secondary | ICD-10-CM

## 2014-02-26 NOTE — Progress Notes (Signed)
   Subjective:    Patient ID: [name], male    DOB: [birth year], 80 y.o.   MRN: [id number]  HPI  Pt presents for nail debridement  Review of Systems     Objective:   Physical Exam        Assessment & Plan:

## 2014-02-26 NOTE — Progress Notes (Signed)
Subjective:     Patient ID: [name], male   DOB: 1926-09-08, 80 y.o.   MRN: [id number]  HPI patient presents with yellow thick nailbeds 1-5 both feet that are painful in shoe gear and he cannot cut   Review of Systems     Objective:   Physical Exam Neurovascular status intact with thick yellow brittle nailbeds 1-5 both feet    Assessment:     Mycotic nail infection with pain 1-5 both feet    Plan:     Debride painful nailbeds 1-5 both feet with no iatrogenic bleeding noted

## 2014-03-16 ENCOUNTER — Ambulatory Visit (INDEPENDENT_AMBULATORY_CARE_PROVIDER_SITE_OTHER): Payer: Medicare Other | Admitting: *Deleted

## 2014-03-16 DIAGNOSIS — Z8673 Personal history of transient ischemic attack (TIA), and cerebral infarction without residual deficits: Secondary | ICD-10-CM

## 2014-03-16 DIAGNOSIS — I6350 Cerebral infarction due to unspecified occlusion or stenosis of unspecified cerebral artery: Secondary | ICD-10-CM

## 2014-03-16 DIAGNOSIS — I4891 Unspecified atrial fibrillation: Secondary | ICD-10-CM

## 2014-03-16 DIAGNOSIS — Z7901 Long term (current) use of anticoagulants: Secondary | ICD-10-CM

## 2014-03-16 LAB — POCT INR: INR: 3.4

## 2014-03-27 ENCOUNTER — Other Ambulatory Visit: Payer: Self-pay | Admitting: Cardiovascular Disease

## 2014-04-06 ENCOUNTER — Ambulatory Visit (INDEPENDENT_AMBULATORY_CARE_PROVIDER_SITE_OTHER): Payer: Medicare Other | Admitting: *Deleted

## 2014-04-06 DIAGNOSIS — I6350 Cerebral infarction due to unspecified occlusion or stenosis of unspecified cerebral artery: Secondary | ICD-10-CM

## 2014-04-06 DIAGNOSIS — Z8673 Personal history of transient ischemic attack (TIA), and cerebral infarction without residual deficits: Secondary | ICD-10-CM

## 2014-04-06 DIAGNOSIS — Z7901 Long term (current) use of anticoagulants: Secondary | ICD-10-CM

## 2014-04-06 DIAGNOSIS — I4891 Unspecified atrial fibrillation: Secondary | ICD-10-CM

## 2014-04-06 LAB — POCT INR: INR: 2.4

## 2014-05-04 ENCOUNTER — Ambulatory Visit (INDEPENDENT_AMBULATORY_CARE_PROVIDER_SITE_OTHER): Payer: Medicare Other

## 2014-05-04 ENCOUNTER — Other Ambulatory Visit: Payer: Self-pay

## 2014-05-04 DIAGNOSIS — I4891 Unspecified atrial fibrillation: Secondary | ICD-10-CM

## 2014-05-04 DIAGNOSIS — Z8673 Personal history of transient ischemic attack (TIA), and cerebral infarction without residual deficits: Secondary | ICD-10-CM

## 2014-05-04 DIAGNOSIS — I639 Cerebral infarction, unspecified: Secondary | ICD-10-CM

## 2014-05-04 DIAGNOSIS — I6350 Cerebral infarction due to unspecified occlusion or stenosis of unspecified cerebral artery: Secondary | ICD-10-CM

## 2014-05-04 DIAGNOSIS — Z7901 Long term (current) use of anticoagulants: Secondary | ICD-10-CM

## 2014-05-04 LAB — POCT INR: INR: 3.6

## 2014-05-14 ENCOUNTER — Other Ambulatory Visit: Payer: Self-pay | Admitting: Cardiovascular Disease

## 2014-05-14 MED ORDER — HYDRALAZINE HCL 50 MG PO TABS: 50.0000 mg | ORAL_TABLET | Freq: Three times a day (TID) | ORAL | Status: DC

## 2014-05-18 ENCOUNTER — Ambulatory Visit (INDEPENDENT_AMBULATORY_CARE_PROVIDER_SITE_OTHER): Payer: Medicare Other | Admitting: *Deleted

## 2014-05-18 DIAGNOSIS — I6350 Cerebral infarction due to unspecified occlusion or stenosis of unspecified cerebral artery: Secondary | ICD-10-CM

## 2014-05-18 DIAGNOSIS — Z5181 Encounter for therapeutic drug level monitoring: Secondary | ICD-10-CM

## 2014-05-18 DIAGNOSIS — Z7901 Long term (current) use of anticoagulants: Secondary | ICD-10-CM

## 2014-05-18 DIAGNOSIS — Z8673 Personal history of transient ischemic attack (TIA), and cerebral infarction without residual deficits: Secondary | ICD-10-CM

## 2014-05-18 DIAGNOSIS — I639 Cerebral infarction, unspecified: Secondary | ICD-10-CM

## 2014-05-18 DIAGNOSIS — Z23 Encounter for immunization: Secondary | ICD-10-CM

## 2014-05-18 DIAGNOSIS — I4891 Unspecified atrial fibrillation: Secondary | ICD-10-CM

## 2014-05-18 LAB — POCT INR: INR: 2.6

## 2014-05-23 ENCOUNTER — Other Ambulatory Visit: Payer: Self-pay | Admitting: *Deleted

## 2014-05-23 MED ORDER — RAMIPRIL 10 MG PO CAPS: ORAL_CAPSULE | ORAL | Status: DC

## 2014-05-29 ENCOUNTER — Encounter: Payer: Self-pay | Admitting: Cardiovascular Disease

## 2014-05-31 ENCOUNTER — Other Ambulatory Visit: Payer: Self-pay

## 2014-05-31 MED ORDER — PRAVASTATIN SODIUM 20 MG PO TABS: 20.0000 mg | ORAL_TABLET | Freq: Every day | ORAL | Status: DC

## 2014-06-08 ENCOUNTER — Ambulatory Visit (INDEPENDENT_AMBULATORY_CARE_PROVIDER_SITE_OTHER): Payer: Medicare Other | Admitting: Pharmacist

## 2014-06-08 DIAGNOSIS — I639 Cerebral infarction, unspecified: Secondary | ICD-10-CM

## 2014-06-08 DIAGNOSIS — Z7901 Long term (current) use of anticoagulants: Secondary | ICD-10-CM

## 2014-06-08 DIAGNOSIS — I6350 Cerebral infarction due to unspecified occlusion or stenosis of unspecified cerebral artery: Secondary | ICD-10-CM

## 2014-06-08 DIAGNOSIS — Z8673 Personal history of transient ischemic attack (TIA), and cerebral infarction without residual deficits: Secondary | ICD-10-CM

## 2014-06-08 DIAGNOSIS — I4891 Unspecified atrial fibrillation: Secondary | ICD-10-CM

## 2014-06-08 LAB — POCT INR: INR: 2.4

## 2014-06-11 ENCOUNTER — Ambulatory Visit (INDEPENDENT_AMBULATORY_CARE_PROVIDER_SITE_OTHER): Payer: Medicare Other | Admitting: Podiatry

## 2014-06-11 ENCOUNTER — Encounter: Payer: Self-pay | Admitting: Podiatry

## 2014-06-11 DIAGNOSIS — M79673 Pain in unspecified foot: Secondary | ICD-10-CM | POA: Diagnosis not present

## 2014-06-11 DIAGNOSIS — B351 Tinea unguium: Secondary | ICD-10-CM | POA: Diagnosis not present

## 2014-06-11 NOTE — Progress Notes (Signed)
Subjective:     Patient ID: [name], male   DOB: 07/18/1927, 80 y.o.   MRN: [id number]  HPI patient presents with yellow thick nailbeds 1-5 both feet that are painful in shoe gear and he cannot cut   Review of Systems     Objective:   Physical Exam Neurovascular status intact with thick yellow brittle nailbeds 1-5 both feet    Assessment:     Mycotic nail infection with pain 1-5 both feet    Plan:     Debride painful nailbeds 1-5 both feet with no iatrogenic bleeding noted      

## 2014-06-28 ENCOUNTER — Encounter (HOSPITAL_COMMUNITY): Payer: Self-pay | Admitting: Cardiovascular Disease

## 2014-07-02 ENCOUNTER — Other Ambulatory Visit: Payer: Self-pay | Admitting: *Deleted

## 2014-07-02 MED ORDER — METOPROLOL SUCCINATE ER 50 MG PO TB24: 50.0000 mg | ORAL_TABLET | Freq: Two times a day (BID) | ORAL | Status: DC

## 2014-07-03 ENCOUNTER — Ambulatory Visit (INDEPENDENT_AMBULATORY_CARE_PROVIDER_SITE_OTHER): Payer: Medicare Other | Admitting: Nurse Practitioner

## 2014-07-03 ENCOUNTER — Ambulatory Visit (INDEPENDENT_AMBULATORY_CARE_PROVIDER_SITE_OTHER): Payer: Medicare Other

## 2014-07-03 ENCOUNTER — Encounter: Payer: Self-pay | Admitting: Nurse Practitioner

## 2014-07-03 VITALS — BP 130/70 | HR 77 | Ht 67.0 in | Wt 182.1 lb

## 2014-07-03 DIAGNOSIS — I480 Paroxysmal atrial fibrillation: Secondary | ICD-10-CM

## 2014-07-03 DIAGNOSIS — Z7901 Long term (current) use of anticoagulants: Secondary | ICD-10-CM

## 2014-07-03 DIAGNOSIS — Z79899 Other long term (current) drug therapy: Secondary | ICD-10-CM

## 2014-07-03 DIAGNOSIS — Z8673 Personal history of transient ischemic attack (TIA), and cerebral infarction without residual deficits: Secondary | ICD-10-CM

## 2014-07-03 DIAGNOSIS — I6350 Cerebral infarction due to unspecified occlusion or stenosis of unspecified cerebral artery: Secondary | ICD-10-CM

## 2014-07-03 DIAGNOSIS — I639 Cerebral infarction, unspecified: Secondary | ICD-10-CM

## 2014-07-03 DIAGNOSIS — I4891 Unspecified atrial fibrillation: Secondary | ICD-10-CM

## 2014-07-03 LAB — BASIC METABOLIC PANEL
BUN: 13 mg/dL (ref 6–23)
CO2: 29 mEq/L (ref 19–32)
Calcium: 9.2 mg/dL (ref 8.4–10.5)
Chloride: 104 mEq/L (ref 96–112)
Creatinine, Ser: 1.4 mg/dL (ref 0.4–1.5)
GFR: 61.91 mL/min (ref >60.00)
Glucose, Bld: 82 mg/dL (ref 70–99)
Potassium: 3.7 mEq/L (ref 3.5–5.1)
Sodium: 138 mEq/L (ref 135–145)

## 2014-07-03 LAB — CBC
HCT: 44.6 % (ref 39.0–52.0)
Hemoglobin: 14.2 g/dL (ref 13.0–17.0)
MCHC: 31.9 g/dL (ref 30.0–36.0)
MCV: 83.5 fl (ref 78.0–100.0)
Platelets: 152 10*3/uL (ref 150.0–400.0)
RBC: 5.34 Mil/uL (ref 4.22–5.81)
RDW: 14.7 % (ref 11.5–15.5)
WBC: 6.8 10*3/uL (ref 4.0–10.5)

## 2014-07-03 LAB — LIPID PANEL
Cholesterol: 145 mg/dL (ref 0–200)
HDL: 52 mg/dL (ref >39.00)
LDL Cholesterol: 69 mg/dL (ref 0–99)
NonHDL: 93
Total CHOL/HDL Ratio: 3
Triglycerides: 118 mg/dL (ref 0.0–149.0)
VLDL: 23.6 mg/dL (ref 0.0–40.0)

## 2014-07-03 LAB — HEPATIC FUNCTION PANEL
ALT: 12 U/L (ref 0–53)
AST: 25 U/L (ref 0–37)
Albumin: 3.7 g/dL (ref 3.5–5.2)
Alkaline Phosphatase: 58 U/L (ref 39–117)
Bilirubin, Direct: 0 mg/dL (ref 0.0–0.3)
Total Bilirubin: 0.6 mg/dL (ref 0.2–1.2)
Total Protein: 7.5 g/dL (ref 6.0–8.3)

## 2014-07-03 LAB — POCT INR: INR: 2

## 2014-07-03 LAB — TSH: TSH: 0.97 u[IU]/mL (ref 0.35–4.50)

## 2014-07-03 NOTE — Progress Notes (Signed)
Regenia Skeeter Date of Birth: [birth year] Medical Record #[id number]  History of Present Illness: [name] is seen back today for his 6 month check. Seen for Dr. Angelena Form. He is an 80 yo male with h/o CAD, paroxysmal atrial fibrillation, CVA, diastolic CHF, HTN, HLD, CRI, & PAD. He has been followed in the past by Dr. Olevia Perches. He has had multiple PCIs. He also has paroxysmal atrial fibrillation which is treated with amiodarone and Coumadin. He had a previous stroke with left-sided weakness a number of years ago. He walks with a cane. He has a history of diastolic CHF and his last ejection fraction was 50-55% by echo August 2013. ABI October 2014 were hard to obtain but TBI normal. PAD is followed by Dr. Gwenlyn Found. Seen in April 2015 and he had c/o chest pain. Stress myoview with ischemia. LVEF=39%. Cardiac cath 11/23/13 with severe stenosis OM1 treated with 2.5 x 12 mm Xience DES. Has chronic LBBB.   Last seen here in June. Felt to be doing well.  Comes back today. Here alone. No chest pain. Stable shortness of breath. No dizziness. No syncope. No falls. Using his cane. Remains on Plavix and Coumadin. No problems noted. Thinks his rhythm has been ok. He has no real issues. He is trying to lose weight.   Current Outpatient Prescriptions  Medication Sig Dispense Refill  . amiodarone (PACERONE) 200 MG tablet Take 100 mg by mouth daily.    Marland Kitchen BEPREVE 1.5 % SOLN Place 1 drop into both eyes 2 (two) times daily.     . clopidogrel (PLAVIX) 75 MG tablet Take 1 tablet (75 mg total) by mouth daily with breakfast. 30 tablet 11  . furosemide (LASIX) 40 MG tablet TAKE 1 TABLET DAILY 90 tablet 0  . hydrALAZINE (APRESOLINE) 50 MG tablet Take 1 tablet (50 mg total) by mouth 3 (three) times daily. 270 tablet 3  . loratadine (ALLERGY RELIEF) 10 MG dissolvable tablet Take 10 mg by mouth daily as needed.     . magnesium oxide (MAG-OX) 400 MG tablet Take 400 mg by mouth 2 (two) times daily.     . metoprolol succinate  (TOPROL-XL) 50 MG 24 hr tablet Take 1 tablet (50 mg total) by mouth 2 (two) times daily. Take with or immediately following a meal. 180 tablet 0  . nitroGLYCERIN (NITROSTAT) 0.4 MG SL tablet Place 1 tablet (0.4 mg total) under the tongue every 5 (five) minutes as needed for chest pain. 25 tablet 2  . pravastatin (PRAVACHOL) 20 MG tablet Take 1 tablet (20 mg total) by mouth at bedtime. 30 tablet 4  . ramipril (ALTACE) 10 MG capsule TAKE 1 CAPSULE DAILY 90 capsule 1  . warfarin (COUMADIN) 2 MG tablet Take 1 tablet (2 mg total) by mouth as directed. 150 tablet 1   No current facility-administered medications for this visit.    No Known Allergies  Past Medical History  Diagnosis Date  . Hx of adenomatous colonic polyps     hx?  . Diverticulosis of colon   . MI (myocardial infarction)   . Cellulitis     arm  . BPH (benign prostatic hypertrophy)     hx of; s/p TURP  . Venous insufficiency of leg     lower extremity edema; probably an element of diastolic heart failure and this was previously aggrevated by Norvasc  . HLD (hyperlipidemia)   . HTN (hypertension)   . CVA (cerebral vascular accident)     hx  . Atrial fibrillation  embolic stroke secondary to a-fi; paroxysmal a-fib, controlled on amiodarone and coumadin    . CHF (congestive heart failure)   . Ischemic cardiomyopathy   . CAD (coronary artery disease)     s/p remote diaphragmatic wall infarction and multiple percutaneous coronary interventions. EF 50%  . Chronic renal insufficiency     creatinine 1.9   . Claudication     Past Surgical History  Procedure Laterality Date  . Turp  1989  . Angioplasty/stent placement    . Lower extremity arterial doppler  12/01/2011    Bilateral ABIs-not accessible due to calcified vessels. Distal Abdominal Aorta-demonstrated aneurysmal dilatation with a measurement of 2.87x3.32cm. Right runoff-anterior tibial artery into dorsalis pedis appears to demonstrate occlusive disease. Left SFA  demonstrates 0-49%. Left runoff-anterior tibial artery demonstrates occlusive disease  . Left heart catheterization with coronary angiogram N/A 11/23/2013    Procedure: LEFT HEART CATHETERIZATION WITH CORONARY ANGIOGRAM;  Surgeon: Burnell Blanks, MD;  Location: [facility] CATH LAB;  Service: Cardiovascular;  Laterality: N/A;  . Percutaneous coronary stent intervention (pci-s)  11/23/2013    Procedure: PERCUTANEOUS CORONARY STENT INTERVENTION (PCI-S);  Surgeon: Burnell Blanks, MD;  Location: [facility] CATH LAB;  Service: Cardiovascular;;    History  Smoking status  . Former Smoker  Smokeless tobacco  . Never Used    Comment: nonsmoker    History  Alcohol Use No    Family History  Problem Relation Age of Onset  . Coronary artery disease      family hx     Review of Systems: The review of systems is per the HPI.  All other systems were reviewed and are negative.  Physical Exam: BP 130/70 mmHg  Pulse 77  Ht 5\' 7"  (1.702 m)  Wt 182 lb 1.9 oz (82.609 kg)  BMI 28.52 kg/m2  SpO2 97% Patient is very pleasant and in no acute distress. Weight is down 6 pounds. He looks younger than his stated age. Skin is warm and dry. Color is normal.  HEENT is unremarkable. Normocephalic/atraumatic. PERRL. Sclera are nonicteric. Neck is supple. No masses. No JVD. Lungs are clear. Cardiac exam shows a regular rate and rhythm. Abdomen is soft. Extremities are without edema. Gait and ROM are intact. No gross neurologic deficits noted.  Wt Readings from Last 3 Encounters:  07/03/14 182 lb 1.9 oz (82.609 kg)  12/22/13 188 lb (85.276 kg)  11/28/13 188 lb 12.8 oz (85.639 kg)    LABORATORY DATA/PROCEDURES:  Lab Results  Component Value Date   WBC 6.7 11/24/2013   HGB 13.6 11/24/2013   HCT 42.7 11/24/2013   PLT 110* 11/24/2013   GLUCOSE 85 11/24/2013   CHOL 143 08/31/2011   TRIG 53.0 08/31/2011   HDL 61.90 08/31/2011   LDLCALC 71 08/31/2011   ALT 26 08/31/2011   AST 36 08/31/2011   NA 142  11/24/2013   K 4.6 11/24/2013   CL 105 11/24/2013   CREATININE 1.42* 11/24/2013   BUN 18 11/24/2013   CO2 25 11/24/2013   TSH 0.64 04/18/2013   INR 2.4 06/08/2014    Lab Results  Component Value Date   INR 2.4 06/08/2014   INR 2.6 05/18/2014   INR 3.6 05/04/2014     BNP (last 3 results) No results for input(s): PROBNP in the last 8760 hours.  Cardiac cath 11/23/13:  Left main: Mild non-obstructive disease.  Left Anterior Descending Artery: Large caliber vessel that courses to the apex. There is diffuse calcification and serial 30% stenoses in the proximal  and mid LAD. There appears to be a stent in the proximal LAD with no restenosis. There is a small to moderate caliber bifurcating diagonal branch with 70% stenosis just prior to the bifurcation, unchanged from last cath.  Circumflex Artery: Large caliber vessel with large bifurcating obtuse marginal branch. The inferior sub-branch of the OM system has a 95% stenosis. The AV groove Circumflex tapers to a small caliber vessel.  Right Coronary Artery: Large dominant vessel with diffuse calcific 30% stenoses in the proximal, mid and distal vessel.     Assessment and Plan:   1. ATRIAL FIBRILLATION: Remains in sinus. Continue with his current regimen. Check follow up labs today.    2. CAD: Stable. Recent PCI/DES OM1 11/23/13. Continue Plavix. No ASA since he is on coumadin.   3. HTN: BP well controlled. No changes.   4. PAD: Stable. No claudication. Followed by Dr. Gwenlyn Found  5. Ischemic cardiomyopathy: looks pretty well compensated.  He is felt to be doing well. No change in his current regimen. Recheck surveillance labs today. See Dr. Angelena Form back in May. Possible d/c Plavix on return and restart ASA. Checking INR today.   Patient is agreeable to this plan and will call if any problems develop in the interim.   Burtis Junes, RN, [place] [address] [place], [place]   [address] [phone]

## 2014-07-03 NOTE — Patient Instructions (Signed)
We will be checking the following labs today BMET, CBC, HPF, Lipid & TSH  Stay on your current medicines  See Dr. Angelena Form in May  Call the [place] office at [phone] if you have any questions, problems or concerns.

## 2014-07-31 ENCOUNTER — Ambulatory Visit (INDEPENDENT_AMBULATORY_CARE_PROVIDER_SITE_OTHER): Payer: Medicare Other | Admitting: *Deleted

## 2014-07-31 DIAGNOSIS — I639 Cerebral infarction, unspecified: Secondary | ICD-10-CM

## 2014-07-31 DIAGNOSIS — I6350 Cerebral infarction due to unspecified occlusion or stenosis of unspecified cerebral artery: Secondary | ICD-10-CM

## 2014-07-31 DIAGNOSIS — Z7901 Long term (current) use of anticoagulants: Secondary | ICD-10-CM

## 2014-07-31 DIAGNOSIS — Z8673 Personal history of transient ischemic attack (TIA), and cerebral infarction without residual deficits: Secondary | ICD-10-CM

## 2014-07-31 DIAGNOSIS — I4891 Unspecified atrial fibrillation: Secondary | ICD-10-CM

## 2014-07-31 LAB — POCT INR: INR: 2.2

## 2014-09-04 ENCOUNTER — Other Ambulatory Visit: Payer: Self-pay | Admitting: Cardiovascular Disease

## 2014-09-06 ENCOUNTER — Other Ambulatory Visit: Payer: Self-pay

## 2014-09-06 MED ORDER — FUROSEMIDE 40 MG PO TABS: 40.0000 mg | ORAL_TABLET | Freq: Every day | ORAL | Status: DC

## 2014-09-10 ENCOUNTER — Ambulatory Visit (INDEPENDENT_AMBULATORY_CARE_PROVIDER_SITE_OTHER): Payer: Medicare Other | Admitting: Podiatry

## 2014-09-10 DIAGNOSIS — B351 Tinea unguium: Secondary | ICD-10-CM | POA: Diagnosis not present

## 2014-09-10 DIAGNOSIS — M79673 Pain in unspecified foot: Secondary | ICD-10-CM | POA: Diagnosis not present

## 2014-09-10 NOTE — Progress Notes (Signed)
Subjective:     Patient ID: [name], male   DOB: 1927/02/25, 81 y.o.   MRN: [id number]  HPI patient presents with yellow thick nailbeds 1-5 both feet that are painful in shoe gear and he cannot cut   Review of Systems     Objective:   Physical Exam Neurovascular status intact with thick yellow brittle nailbeds 1-5 both feet    Assessment:     Mycotic nail infection with pain 1-5 both feet    Plan:     Debride painful nailbeds 1-5 both feet with no iatrogenic bleeding noted

## 2014-09-11 ENCOUNTER — Ambulatory Visit (INDEPENDENT_AMBULATORY_CARE_PROVIDER_SITE_OTHER): Payer: Medicare Other | Admitting: *Deleted

## 2014-09-11 DIAGNOSIS — Z7901 Long term (current) use of anticoagulants: Secondary | ICD-10-CM

## 2014-09-11 DIAGNOSIS — I6350 Cerebral infarction due to unspecified occlusion or stenosis of unspecified cerebral artery: Secondary | ICD-10-CM

## 2014-09-11 DIAGNOSIS — Z8673 Personal history of transient ischemic attack (TIA), and cerebral infarction without residual deficits: Secondary | ICD-10-CM

## 2014-09-11 DIAGNOSIS — I4891 Unspecified atrial fibrillation: Secondary | ICD-10-CM

## 2014-09-11 DIAGNOSIS — I639 Cerebral infarction, unspecified: Secondary | ICD-10-CM

## 2014-09-11 LAB — POCT INR: INR: 2.4

## 2014-09-13 ENCOUNTER — Telehealth: Payer: Self-pay

## 2014-09-13 MED ORDER — WARFARIN SODIUM 2 MG PO TABS: 2.0000 mg | ORAL_TABLET | ORAL | Status: DC

## 2014-09-13 NOTE — Telephone Encounter (Signed)
Rx done. 

## 2014-09-14 ENCOUNTER — Other Ambulatory Visit: Payer: Self-pay | Admitting: Pharmacist

## 2014-09-14 ENCOUNTER — Other Ambulatory Visit: Payer: Self-pay

## 2014-09-14 MED ORDER — AMIODARONE HCL 200 MG PO TABS: 100.0000 mg | ORAL_TABLET | Freq: Every day | ORAL | Status: DC

## 2014-09-14 MED ORDER — WARFARIN SODIUM 2 MG PO TABS: 2.0000 mg | ORAL_TABLET | ORAL | Status: DC

## 2014-09-18 ENCOUNTER — Other Ambulatory Visit: Payer: Self-pay | Admitting: *Deleted

## 2014-09-19 MED ORDER — WARFARIN SODIUM 2 MG PO TABS: 2.0000 mg | ORAL_TABLET | ORAL | Status: DC

## 2014-10-02 ENCOUNTER — Other Ambulatory Visit: Payer: Self-pay

## 2014-10-02 MED ORDER — METOPROLOL SUCCINATE ER 50 MG PO TB24: 50.0000 mg | ORAL_TABLET | Freq: Two times a day (BID) | ORAL | Status: DC

## 2014-10-23 ENCOUNTER — Ambulatory Visit (INDEPENDENT_AMBULATORY_CARE_PROVIDER_SITE_OTHER): Payer: Medicare Other | Admitting: *Deleted

## 2014-10-23 DIAGNOSIS — I4891 Unspecified atrial fibrillation: Secondary | ICD-10-CM | POA: Diagnosis not present

## 2014-10-23 DIAGNOSIS — I6350 Cerebral infarction due to unspecified occlusion or stenosis of unspecified cerebral artery: Secondary | ICD-10-CM

## 2014-10-23 DIAGNOSIS — Z8673 Personal history of transient ischemic attack (TIA), and cerebral infarction without residual deficits: Secondary | ICD-10-CM | POA: Diagnosis not present

## 2014-10-23 DIAGNOSIS — I639 Cerebral infarction, unspecified: Secondary | ICD-10-CM | POA: Diagnosis not present

## 2014-10-23 DIAGNOSIS — Z7901 Long term (current) use of anticoagulants: Secondary | ICD-10-CM | POA: Diagnosis not present

## 2014-10-23 LAB — POCT INR: INR: 2.9

## 2014-11-08 ENCOUNTER — Other Ambulatory Visit: Payer: Self-pay

## 2014-11-08 MED ORDER — PRAVASTATIN SODIUM 20 MG PO TABS: 20.0000 mg | ORAL_TABLET | Freq: Every day | ORAL | Status: DC

## 2014-11-15 ENCOUNTER — Other Ambulatory Visit: Payer: Self-pay | Admitting: Cardiovascular Disease

## 2014-11-15 DIAGNOSIS — I739 Peripheral vascular disease, unspecified: Secondary | ICD-10-CM

## 2014-11-23 ENCOUNTER — Telehealth: Payer: Self-pay

## 2014-11-23 MED ORDER — RAMIPRIL 10 MG PO CAPS: ORAL_CAPSULE | ORAL | Status: DC

## 2014-11-23 MED ORDER — CLOPIDOGREL BISULFATE 75 MG PO TABS: 75.0000 mg | ORAL_TABLET | Freq: Every day | ORAL | Status: DC

## 2014-11-23 NOTE — Telephone Encounter (Signed)
refill 

## 2014-12-07 ENCOUNTER — Ambulatory Visit (INDEPENDENT_AMBULATORY_CARE_PROVIDER_SITE_OTHER): Payer: Medicare Other | Admitting: Cardiovascular Disease

## 2014-12-07 ENCOUNTER — Ambulatory Visit (INDEPENDENT_AMBULATORY_CARE_PROVIDER_SITE_OTHER): Payer: Medicare Other | Admitting: *Deleted

## 2014-12-07 ENCOUNTER — Encounter: Payer: Self-pay | Admitting: Cardiovascular Disease

## 2014-12-07 VITALS — BP 128/80 | HR 66 | Ht 67.0 in | Wt 177.8 lb

## 2014-12-07 DIAGNOSIS — I6350 Cerebral infarction due to unspecified occlusion or stenosis of unspecified cerebral artery: Secondary | ICD-10-CM

## 2014-12-07 DIAGNOSIS — I2510 Atherosclerotic heart disease of native coronary artery without angina pectoris: Secondary | ICD-10-CM

## 2014-12-07 DIAGNOSIS — Z8673 Personal history of transient ischemic attack (TIA), and cerebral infarction without residual deficits: Secondary | ICD-10-CM

## 2014-12-07 DIAGNOSIS — I639 Cerebral infarction, unspecified: Secondary | ICD-10-CM

## 2014-12-07 DIAGNOSIS — I480 Paroxysmal atrial fibrillation: Secondary | ICD-10-CM | POA: Diagnosis not present

## 2014-12-07 DIAGNOSIS — I255 Ischemic cardiomyopathy: Secondary | ICD-10-CM

## 2014-12-07 DIAGNOSIS — I10 Essential (primary) hypertension: Secondary | ICD-10-CM | POA: Diagnosis not present

## 2014-12-07 DIAGNOSIS — Z7901 Long term (current) use of anticoagulants: Secondary | ICD-10-CM | POA: Diagnosis not present

## 2014-12-07 DIAGNOSIS — I4891 Unspecified atrial fibrillation: Secondary | ICD-10-CM

## 2014-12-07 DIAGNOSIS — I739 Peripheral vascular disease, unspecified: Secondary | ICD-10-CM

## 2014-12-07 LAB — POCT INR: INR: 2.5

## 2014-12-07 MED ORDER — NITROGLYCERIN 0.4 MG SL SUBL: 0.4000 mg | SUBLINGUAL_TABLET | SUBLINGUAL | Status: AC | PRN

## 2014-12-07 NOTE — Progress Notes (Signed)
Chief Complaint  Patient presents with  . Shortness of Breath     History of Present Illness: 81 yo male with h/o CAD, paroxysmal atrial fibrillation, CVA, diastolic CHF, HTN, HLD, CRI, PAD here today for cardiac follow up. He has been followed in the past by Dr. Olevia Perches. He has had multiple PCI. He also has paroxysmal atrial fibrillation which is treated with amiodarone and Coumadin. He had a previous stroke with left-sided weakness a number of years ago. He walks with a cane. He has a history of diastolic CHF and his last ejection fraction was 50-55% by echo August 2013. ABI October 2014 were hard to obtain but TBI normal. PAD is followed by Dr. Gwenlyn Found. I saw him April 2015 and he had c/o chest pain. Stress myoview with ischemia. LVEF=39%. Cardiac cath 11/23/13 with severe stenosis OM1 treated with 2.5 x 12 mm Xience DES.    He is here today for cardiac follow up.   No chest pain or SOB. Feeling well. No leg pain.   Primary Care Physician: Lucianne Lei  Last Lipid Profile: Followed in primary care.   Past Medical History  Diagnosis Date  . Hx of adenomatous colonic polyps     hx?  . Diverticulosis of colon   . MI (myocardial infarction)   . Cellulitis     arm  . BPH (benign prostatic hypertrophy)     hx of; s/p TURP  . Venous insufficiency of leg     lower extremity edema; probably an element of diastolic heart failure and this was previously aggrevated by Norvasc  . HLD (hyperlipidemia)   . HTN (hypertension)   . CVA (cerebral vascular accident)     hx  . Atrial fibrillation     embolic stroke secondary to a-fi; paroxysmal a-fib, controlled on amiodarone and coumadin    . CHF (congestive heart failure)   . Ischemic cardiomyopathy   . CAD (coronary artery disease)     s/p remote diaphragmatic wall infarction and multiple percutaneous coronary interventions. EF 50%  . Chronic renal insufficiency     creatinine 1.9   . Claudication     Past Surgical History  Procedure  Laterality Date  . Turp  1989  . Angioplasty/stent placement    . Lower extremity arterial doppler  12/01/2011    Bilateral ABIs-not accessible due to calcified vessels. Distal Abdominal Aorta-demonstrated aneurysmal dilatation with a measurement of 2.87x3.32cm. Right runoff-anterior tibial artery into dorsalis pedis appears to demonstrate occlusive disease. Left SFA demonstrates 0-49%. Left runoff-anterior tibial artery demonstrates occlusive disease  . Left heart catheterization with coronary angiogram N/A 11/23/2013    Procedure: LEFT HEART CATHETERIZATION WITH CORONARY ANGIOGRAM;  Surgeon: Burnell Blanks, MD;  Location: [facility] CATH LAB;  Service: Cardiovascular;  Laterality: N/A;  . Percutaneous coronary stent intervention (pci-s)  11/23/2013    Procedure: PERCUTANEOUS CORONARY STENT INTERVENTION (PCI-S);  Surgeon: Burnell Blanks, MD;  Location[facility] CATH LAB;  Service: Cardiovascular;;    Current Outpatient Prescriptions  Medication Sig Dispense Refill  . amiodarone (PACERONE) 200 MG tablet Take 0.5 tablets (100 mg total) by mouth daily. 30 tablet 3  . clopidogrel (PLAVIX) 75 MG tablet Take 1 tablet (75 mg total) by mouth daily with breakfast. 90 tablet 1  . furosemide (LASIX) 40 MG tablet Take 1 tablet (40 mg total) by mouth daily. 90 tablet 0  . hydrALAZINE (APRESOLINE) 50 MG tablet Take 1 tablet (50 mg total) by mouth 3 (three) times daily. 270 tablet 3  .  loratadine (ALLERGY RELIEF) 10 MG dissolvable tablet Take 10 mg by mouth daily as needed for allergies.     . magnesium oxide (MAG-OX) 400 MG tablet Take 400 mg by mouth 2 (two) times daily.     . metoprolol succinate (TOPROL-XL) 50 MG 24 hr tablet Take 1 tablet (50 mg total) by mouth 2 (two) times daily. Take with or immediately following a meal. 180 tablet 0  . nitroGLYCERIN (NITROSTAT) 0.4 MG SL tablet Place 1 tablet (0.4 mg total) under the tongue every 5 (five) minutes as needed for chest pain (MAX 3 TABLETS). 25 tablet 6  .  pravastatin (PRAVACHOL) 20 MG tablet Take 1 tablet (20 mg total) by mouth at bedtime. 90 tablet 0  . ramipril (ALTACE) 10 MG capsule TAKE 1 CAPSULE DAILY (Patient taking differently: TAKE 1 CAPSULE BY MOUTH DAILY) 90 capsule 1  . warfarin (COUMADIN) 2 MG tablet Take 1-1.5 tablets (2-3 mg total) by mouth as directed. 140 tablet 1   No current facility-administered medications for this visit.    No Known Allergies  History   Social History  . Marital Status: Widowed    Spouse Name: N/A  . Number of Children: N/A  . Years of Education: N/A   Occupational History  . Not on file.   Social History Main Topics  . Smoking status: Former Research scientist (life sciences)  . Smokeless tobacco: Never Used     Comment: nonsmoker  . Alcohol Use: No  . Drug Use: Not on file  . Sexual Activity: Not on file   Other Topics Concern  . Not on file   Social History Narrative   Worked as a Recruitment consultant.     Family History  Problem Relation Age of Onset  . Coronary artery disease      family hx     Review of Systems:  As stated in the HPI and otherwise negative.   BP 128/80 mmHg  Pulse 66  Ht 5\' 7"  (1.702 m)  Wt 177 lb 12.8 oz (80.65 kg)  BMI 27.84 kg/m2  Physical Examination: General: Well developed, well nourished, NAD HEENT: OP clear, mucus membranes moist SKIN: warm, dry. No rashes. Neuro: No focal deficits Musculoskeletal: Muscle strength 5/5 all ext Psychiatric: Mood and affect normal Neck: No JVD, no carotid bruits, no thyromegaly, no lymphadenopathy. Lungs:Clear bilaterally, no wheezes, rhonci, crackles Cardiovascular: Regular rate and rhythm. No murmurs, gallops or rubs. Abdomen:Soft. Bowel sounds present. Non-tender.  Extremities: No lower extremity edema. Pulses are 2 + in the bilateral DP/PT.  Echo 03/02/12: The study was technically difficult, as a result of poor sound wave transmission. - Left ventricle: The cavity size was normal. Wall thickness was increased in a pattern of mild LVH.  Systolic function was normal. The estimated ejection fraction was in the range of 50% to 55%. - Left atrium: The atrium was moderately dilated.  Stress myoview 11/10/13: Raw Data Images: There is notably increased hepatic tracer  uptake that partially obscures the inferoseptal wall.   Stress Images: There is decreased uptake in the anterior wall. A moderate sized,medium intensity, fixed perfusion defect in the mid to apical anterior-apical wall. There is decreased uptake in the inferior wall. There is decreased uptake in the septum. A  large sized, severe intensity, fixed perfusion defect in the  entire inferior-inferoseptal (basal to apical). There is  decreased uptake in the apex. No Reversibility is seen.  Rest Images: There is decreased uptake in the anterior wall.  There is decreased uptake in the  inferior wall. There is  decreased uptake in the septum. Comparison with the stress images reveals no significant change.  LV Wall Motion: Gated images confirm essentially severe  hypokinesis to akinesis of the inferior-inferoseptal wall and at  least moderate anterior hypokinesis -- consistent with prior  infarctions in the LAD & RCA distribution.  Subtraction (SDS): There appear to be 2 separate fixed defects with significant correlating wall motion abnormalities that are  most consistent with a previous infarctions. No reversibility is  appreciated. No evidence of ischemia.   Impression Exercise Capacity: Lexiscan with no exercise. BP Response: Normal blood pressure response. Clinical Symptoms: There is dyspnea. ECG Impression: No significant ECG changes with Lexiscan. Comparison with Prior Nuclear Study: No images to compare  Overall Impression: Intermediate risk stress nuclear study with  2 separate defects consistent with signficant sized infarctions  with moderately reduced LVEF..  Cardiac cath 11/23/13:  Left main: Mild non-obstructive disease.  Left Anterior  Descending Artery: Large caliber vessel that courses to the apex. There is diffuse calcification and serial 30% stenoses in the proximal and mid LAD. There appears to be a stent in the proximal LAD with no restenosis. There is a small to moderate caliber bifurcating diagonal branch with 70% stenosis just prior to the bifurcation, unchanged from last cath.  Circumflex Artery: Large caliber vessel with large bifurcating obtuse marginal branch. The inferior sub-branch of the OM system has a 95% stenosis. The AV groove Circumflex tapers to a small caliber vessel.  Right Coronary Artery: Large dominant vessel with diffuse calcific 30% stenoses in the proximal, mid and distal vessel.   EKG:  EKG is ordered today. The ekg ordered today demonstrates Atrial fib, LBBB. Rate 66 bpm  Recent Labs: 07/03/2014: ALT 12; BUN 13; Creatinine 1.4; Hemoglobin 14.2; Platelets 152.0; Potassium 3.7; Sodium 138; TSH 0.97   Lipid Panel    Component Value Date/Time   CHOL 145 07/03/2014 1458   TRIG 118.0 07/03/2014 1458   HDL 52.00 07/03/2014 1458   CHOLHDL 3 07/03/2014 1458   VLDL 23.6 07/03/2014 1458   LDLCALC 69 07/03/2014 1458     Wt Readings from Last 3 Encounters:  12/07/14 177 lb 12.8 oz (80.65 kg)  07/03/14 182 lb 1.9 oz (82.609 kg)  12/22/13 188 lb (85.276 kg)     Other studies Reviewed: Additional studies/ records that were reviewed today include: . Review of the above records demonstrates:    Assessment and Plan:   1. ATRIAL FIBRILLATION, paroxsymal: Atrial fib today. Continue amiodarone and beta blocker. Continue coumadin. CXR/CT chest and TSH ok October 2014. He has eye exams every 3 months.       2. CAD: Stable. Recent PCI/DES OM1 11/23/13. Continue Plavix. No ASA since he is on coumadin.   3. HTN: BP well controlled. No changes.   4. PAD: Stable. No claudication. Followed by Dr. Gwenlyn Found  5. Ischemic cardiomyopathy: LVEF=39% by nuclear stress test. Continue medical management.   Current  medicines are reviewed at length with the patient today.  The patient does not have concerns regarding medicines.  The following changes have been made:  no change  Labs/ tests ordered today include:   Orders Placed This Encounter  Procedures  . EKG 12-Lead    Disposition:   FU with me in 6 months  Signed, Lauree Chandler, MD 12/07/2014 4:45 PM    [facility] [place], [place], [place]  [address] Phone: [phone]; Fax: [phone]

## 2014-12-07 NOTE — Patient Instructions (Signed)
Medication Instructions:  Your physician recommends that you continue on your current medications as directed. Please refer to the Current Medication list given to you today.   Labwork: none  Testing/Procedures: none  Follow-Up: Your physician wants you to follow-up in: 6 months.  You will receive a reminder letter in the mail two months in advance. If you don't receive a letter, please call our office to schedule the follow-up appointment.       

## 2014-12-10 ENCOUNTER — Other Ambulatory Visit: Payer: Self-pay | Admitting: Cardiovascular Disease

## 2014-12-10 ENCOUNTER — Ambulatory Visit (HOSPITAL_COMMUNITY)
Admission: RE | Admit: 2014-12-10 | Discharge: 2014-12-10 | Disposition: A | Discharge status: Home / Self Care | Payer: Medicare Other | Source: Ambulatory Visit | Attending: Internal Medicine | Admitting: Internal Medicine

## 2014-12-10 DIAGNOSIS — I714 Abdominal aortic aneurysm, without rupture: Secondary | ICD-10-CM | POA: Insufficient documentation

## 2014-12-10 DIAGNOSIS — I739 Peripheral vascular disease, unspecified: Secondary | ICD-10-CM

## 2014-12-10 NOTE — Progress Notes (Signed)
Arterial Lower Ext. Duplex Completed. Mild plaque visualized bilaterally without evidence of a significant stenosis. Bilateral ABIs were non-compressible. Bilateral TBIs appeared normal. Oda Cogan, BS, RDMS, RVT

## 2014-12-24 ENCOUNTER — Ambulatory Visit: Payer: Medicare Other

## 2014-12-25 ENCOUNTER — Ambulatory Visit (INDEPENDENT_AMBULATORY_CARE_PROVIDER_SITE_OTHER): Payer: Medicare Other | Admitting: Podiatry

## 2014-12-25 ENCOUNTER — Encounter: Payer: Self-pay | Admitting: Podiatry

## 2014-12-25 DIAGNOSIS — B351 Tinea unguium: Secondary | ICD-10-CM

## 2014-12-25 DIAGNOSIS — M79673 Pain in unspecified foot: Secondary | ICD-10-CM

## 2014-12-25 NOTE — Progress Notes (Signed)
Patient ID: [name], male   DOB: [birth year], 81 y.o.   MRN: [id number] Complaint:  Visit Type: Patient returns to my office for continued preventative foot care services. Complaint: Patient states" my nails have grown long and thick and become painful to walk and wear shoes" Patient has hx of CVA. He presents for preventative foot care services. No changes to ROS  Podiatric Exam: Vascular: dorsalis pedis and posterior tibial pulses are palpable bilateral. Capillary return is immediate. Temperature gradient is WNL. Skin turgor WNL  Sensorium: Normal Semmes Weinstein monofilament test. Normal tactile sensation bilaterally. Nail Exam: Pt has thick disfigured discolored nails with subungual debris noted bilateral entire nail hallux through fifth toenails Ulcer Exam: There is no evidence of ulcer or pre-ulcerative changes or infection. Orthopedic Exam: Muscle tone and strength are WNL. No limitations in general ROM. No crepitus or effusions noted. Foot type and digits show no abnormalities. Bony prominences are unremarkable. Skin: No Porokeratosis. No infection or ulcers  Diagnosis:  Tinea unguium, Pain in right toe, pain in left toes  Treatment & Plan Procedures and Treatment: Consent by patient was obtained for treatment procedures. The patient understood the discussion of treatment and procedures well. All questions were answered thoroughly reviewed. Debridement of mycotic and hypertrophic toenails, 1 through 5 bilateral and clearing of subungual debris. No ulceration, no infection noted.  Return Visit-Office Procedure: Patient instructed to return to the office for a follow up visit 3 months for continued evaluation and treatment.

## 2015-01-01 ENCOUNTER — Other Ambulatory Visit: Payer: Self-pay | Admitting: *Deleted

## 2015-01-01 MED ORDER — METOPROLOL SUCCINATE ER 50 MG PO TB24: 50.0000 mg | ORAL_TABLET | Freq: Two times a day (BID) | ORAL | Status: DC

## 2015-01-14 ENCOUNTER — Other Ambulatory Visit: Payer: Self-pay

## 2015-01-18 ENCOUNTER — Ambulatory Visit (INDEPENDENT_AMBULATORY_CARE_PROVIDER_SITE_OTHER): Payer: Medicare Other | Admitting: *Deleted

## 2015-01-18 DIAGNOSIS — Z8673 Personal history of transient ischemic attack (TIA), and cerebral infarction without residual deficits: Secondary | ICD-10-CM | POA: Diagnosis not present

## 2015-01-18 DIAGNOSIS — I4891 Unspecified atrial fibrillation: Secondary | ICD-10-CM | POA: Diagnosis not present

## 2015-01-18 DIAGNOSIS — Z7901 Long term (current) use of anticoagulants: Secondary | ICD-10-CM | POA: Diagnosis not present

## 2015-01-18 DIAGNOSIS — I639 Cerebral infarction, unspecified: Secondary | ICD-10-CM | POA: Diagnosis not present

## 2015-01-18 DIAGNOSIS — I6350 Cerebral infarction due to unspecified occlusion or stenosis of unspecified cerebral artery: Secondary | ICD-10-CM

## 2015-01-18 LAB — POCT INR: INR: 2.3

## 2015-01-22 ENCOUNTER — Other Ambulatory Visit: Payer: Self-pay | Admitting: *Deleted

## 2015-01-22 MED ORDER — FUROSEMIDE 40 MG PO TABS: 40.0000 mg | ORAL_TABLET | Freq: Every day | ORAL | Status: DC

## 2015-02-14 ENCOUNTER — Other Ambulatory Visit: Payer: Self-pay | Admitting: Cardiovascular Disease

## 2015-02-14 MED ORDER — PRAVASTATIN SODIUM 20 MG PO TABS: 20.0000 mg | ORAL_TABLET | Freq: Every day | ORAL | Status: DC

## 2015-03-01 ENCOUNTER — Ambulatory Visit (INDEPENDENT_AMBULATORY_CARE_PROVIDER_SITE_OTHER): Payer: Medicare Other | Admitting: *Deleted

## 2015-03-01 DIAGNOSIS — Z8673 Personal history of transient ischemic attack (TIA), and cerebral infarction without residual deficits: Secondary | ICD-10-CM | POA: Diagnosis not present

## 2015-03-01 DIAGNOSIS — I4891 Unspecified atrial fibrillation: Secondary | ICD-10-CM | POA: Diagnosis not present

## 2015-03-01 DIAGNOSIS — Z7901 Long term (current) use of anticoagulants: Secondary | ICD-10-CM | POA: Diagnosis not present

## 2015-03-01 DIAGNOSIS — I639 Cerebral infarction, unspecified: Secondary | ICD-10-CM | POA: Diagnosis not present

## 2015-03-01 DIAGNOSIS — I6350 Cerebral infarction due to unspecified occlusion or stenosis of unspecified cerebral artery: Secondary | ICD-10-CM

## 2015-03-01 LAB — POCT INR: INR: 2.9

## 2015-03-27 ENCOUNTER — Other Ambulatory Visit: Payer: Self-pay | Admitting: *Deleted

## 2015-03-27 MED ORDER — WARFARIN SODIUM 2 MG PO TABS: 2.0000 mg | ORAL_TABLET | ORAL | Status: DC

## 2015-04-02 ENCOUNTER — Encounter: Payer: Self-pay | Admitting: Podiatry

## 2015-04-02 ENCOUNTER — Ambulatory Visit (INDEPENDENT_AMBULATORY_CARE_PROVIDER_SITE_OTHER): Payer: Medicare Other | Admitting: Podiatry

## 2015-04-02 DIAGNOSIS — B351 Tinea unguium: Secondary | ICD-10-CM

## 2015-04-02 DIAGNOSIS — M79673 Pain in unspecified foot: Secondary | ICD-10-CM

## 2015-04-02 NOTE — Progress Notes (Signed)
Patient ID: [name], male   DOB: 12-04-1926, 81 y.o.   MRN: [id number] Complaint:  Visit Type: Patient returns to my office for continued preventative foot care services. Complaint: Patient states" my nails have grown long and thick and become painful to walk and wear shoes" Patient has hx of CVA. He presents for preventative foot care services. No changes to ROS  Podiatric Exam: Vascular: dorsalis pedis are palpable B/L.   Posterior tibial pulses are palpable not  bilateral. Capillary return is immediate. Temperature gradient is diminished.. Skin turgor WNL  Sensorium: Normal Semmes Weinstein monofilament test. Normal tactile sensation bilaterally. Nail Exam: Pt has thick disfigured discolored nails with subungual debris noted bilateral entire nail hallux through fifth toenails Ulcer Exam: There is no evidence of ulcer or pre-ulcerative changes or infection. Orthopedic Exam: Muscle tone and strength are WNL. No limitations in general ROM. No crepitus or effusions noted. Foot type and digits show no abnormalities. Bony prominences are unremarkable. Skin: No Porokeratosis. No infection or ulcers  Diagnosis:  Tinea unguium, Pain in right toe, pain in left toes  Treatment & Plan Procedures and Treatment: Consent by patient was obtained for treatment procedures. The patient understood the discussion of treatment and procedures well. All questions were answered thoroughly reviewed. Debridement of mycotic and hypertrophic toenails, 1 through 5 bilateral and clearing of subungual debris. No ulceration, no infection noted.  Return Visit-Office Procedure: Patient instructed to return to the office for a follow up visit 3 months for continued evaluation and treatment.

## 2015-04-12 ENCOUNTER — Ambulatory Visit (INDEPENDENT_AMBULATORY_CARE_PROVIDER_SITE_OTHER): Payer: Medicare Other | Admitting: *Deleted

## 2015-04-12 DIAGNOSIS — I639 Cerebral infarction, unspecified: Secondary | ICD-10-CM | POA: Diagnosis not present

## 2015-04-12 DIAGNOSIS — Z7901 Long term (current) use of anticoagulants: Secondary | ICD-10-CM

## 2015-04-12 DIAGNOSIS — I6350 Cerebral infarction due to unspecified occlusion or stenosis of unspecified cerebral artery: Secondary | ICD-10-CM

## 2015-04-12 DIAGNOSIS — Z8673 Personal history of transient ischemic attack (TIA), and cerebral infarction without residual deficits: Secondary | ICD-10-CM

## 2015-04-12 DIAGNOSIS — I4891 Unspecified atrial fibrillation: Secondary | ICD-10-CM | POA: Diagnosis not present

## 2015-04-12 LAB — POCT INR: INR: 2.8

## 2015-05-16 ENCOUNTER — Other Ambulatory Visit: Payer: Self-pay

## 2015-05-16 MED ORDER — AMIODARONE HCL 200 MG PO TABS: 100.0000 mg | ORAL_TABLET | Freq: Every day | ORAL | Status: DC

## 2015-05-20 ENCOUNTER — Other Ambulatory Visit: Payer: Self-pay

## 2015-05-20 MED ORDER — RAMIPRIL 10 MG PO CAPS
ORAL_CAPSULE | ORAL | Status: DC
Start: 2015-05-20 — End: 2016-02-17

## 2015-05-20 MED ORDER — CLOPIDOGREL BISULFATE 75 MG PO TABS: 75.0000 mg | ORAL_TABLET | Freq: Every day | ORAL | Status: DC

## 2015-05-20 MED ORDER — FUROSEMIDE 40 MG PO TABS: 40.0000 mg | ORAL_TABLET | Freq: Every day | ORAL | Status: DC

## 2015-05-24 ENCOUNTER — Ambulatory Visit (INDEPENDENT_AMBULATORY_CARE_PROVIDER_SITE_OTHER): Payer: Medicare Other | Admitting: *Deleted

## 2015-05-24 DIAGNOSIS — I6350 Cerebral infarction due to unspecified occlusion or stenosis of unspecified cerebral artery: Secondary | ICD-10-CM

## 2015-05-24 DIAGNOSIS — I4891 Unspecified atrial fibrillation: Secondary | ICD-10-CM

## 2015-05-24 DIAGNOSIS — Z7901 Long term (current) use of anticoagulants: Secondary | ICD-10-CM | POA: Diagnosis not present

## 2015-05-24 DIAGNOSIS — Z8673 Personal history of transient ischemic attack (TIA), and cerebral infarction without residual deficits: Secondary | ICD-10-CM | POA: Diagnosis not present

## 2015-05-24 LAB — POCT INR: INR: 3

## 2015-06-20 NOTE — Progress Notes (Signed)
Chief Complaint  Patient presents with  . Follow-up    paf/cad, pt c/o sob and swelling in both feet and legs     History of Present Illness: 81 yo male with h/o CAD, paroxysmal atrial fibrillation, CVA, diastolic CHF, HTN, HLD, CRI, PAD here today for cardiac follow up. He has been followed in the past by Dr. Olevia Perches. He has had multiple PCI. He also has paroxysmal atrial fibrillation which is treated with amiodarone and Coumadin. He had a previous stroke with left-sided weakness a number of years ago. He walks with a cane. He has a history of diastolic CHF and his last ejection fraction was 50-55% by echo August 2013. ABI October 2014 were hard to obtain but TBI normal. PAD is followed by Dr. Gwenlyn Found. I saw him April 2015 and he had c/o chest pain. Stress myoview with ischemia. LVEF=39%. Cardiac cath 11/23/13 with severe stenosis OM1 treated with 2.5 x 12 mm Xience DES.    He is here today for cardiac follow up.   No chest pain or SOB. Feeling well. No leg pain.   Primary Care Physician: Lucianne Lei  Last Lipid Profile: Followed in primary care.   Past Medical History  Diagnosis Date  . Hx of adenomatous colonic polyps     hx?  . Diverticulosis of colon   . MI (myocardial infarction) ([place])   . Cellulitis     arm  . BPH (benign prostatic hypertrophy)     hx of; s/p TURP  . Venous insufficiency of leg     lower extremity edema; probably an element of diastolic heart failure and this was previously aggrevated by Norvasc  . HLD (hyperlipidemia)   . HTN (hypertension)   . CVA (cerebral vascular accident) ([place])     hx  . Atrial fibrillation (H[facility])     embolic stroke secondary to a-fi; paroxysmal a-fib, controlled on amiodarone and coumadin    . CHF (congestive heart failure) ([place])   . Ischemic cardiomyopathy   . CAD (coronary artery disease)     s/p remote diaphragmatic wall infarction and multiple percutaneous coronary interventions. EF 50%  . Chronic renal insufficiency    creatinine 1.9   . Claudication [facility])     Past Surgical History  Procedure Laterality Date  . Turp  1989  . Angioplasty/stent placement    . Lower extremity arterial doppler  12/01/2011    Bilateral ABIs-not accessible due to calcified vessels. Distal Abdominal Aorta-demonstrated aneurysmal dilatation with a measurement of 2.87x3.32cm. Right runoff-anterior tibial artery into dorsalis pedis appears to demonstrate occlusive disease. Left SFA demonstrates 0-49%. Left runoff-anterior tibial artery demonstrates occlusive disease  . Left heart catheterization with coronary angiogram N/A 11/23/2013    Procedure: LEFT HEART CATHETERIZATION WITH CORONARY ANGIOGRAM;  Surgeon: Burnell Blanks, MD;  Location: [facility] CATH LAB;  Service: Cardiovascular;  Laterality: N/A;  . Percutaneous coronary stent intervention (pci-s)  11/23/2013    Procedure: PERCUTANEOUS CORONARY STENT INTERVENTION (PCI-S);  Surgeon: Burnell Blanks, MD;  Location: [facility] CATH LAB;  Service: Cardiovascular;;    Current Outpatient Prescriptions  Medication Sig Dispense Refill  . amiodarone (PACERONE) 200 MG tablet Take 0.5 tablets (100 mg total) by mouth daily. 45 tablet 1  . clopidogrel (PLAVIX) 75 MG tablet Take 1 tablet (75 mg total) by mouth daily with breakfast. 90 tablet 2  . D3-50 50000 UNITS capsule Take 500,000 Units by mouth.  1  . furosemide (LASIX) 40 MG tablet Take 1 tablet (40 mg total)  by mouth daily. 90 tablet 2  . hydrALAZINE (APRESOLINE) 50 MG tablet Take 1 tablet (50 mg total) by mouth 3 (three) times daily. 270 tablet 3  . loratadine (ALLERGY RELIEF) 10 MG dissolvable tablet Take 10 mg by mouth daily as needed for allergies.     . magnesium oxide (MAG-OX) 400 MG tablet Take 400 mg by mouth 2 (two) times daily.     . metoprolol succinate (TOPROL-XL) 50 MG 24 hr tablet Take 1 tablet (50 mg total) by mouth 2 (two) times daily. Take with or immediately following a meal. 180 tablet 3  . nitroGLYCERIN (NITROSTAT) 0.4  MG SL tablet Place 1 tablet (0.4 mg total) under the tongue every 5 (five) minutes as needed for chest pain (MAX 3 TABLETS). 25 tablet 6  . pravastatin (PRAVACHOL) 20 MG tablet Take 1 tablet (20 mg total) by mouth at bedtime. 90 tablet 3  . ramipril (ALTACE) 10 MG capsule TAKE 1 CAPSULE BY MOUTH DAILY 90 capsule 2  . warfarin (COUMADIN) 2 MG tablet Take 1-1.5 tablets (2-3 mg total) by mouth as directed. 140 tablet 1   No current facility-administered medications for this visit.    No Known Allergies  Social History   Social History  . Marital Status: Widowed    Spouse Name: N/A  . Number of Children: N/A  . Years of Education: N/A   Occupational History  . Not on file.   Social History Main Topics  . Smoking status: Former Research scientist (life sciences)  . Smokeless tobacco: Never Used     Comment: nonsmoker  . Alcohol Use: No  . Drug Use: Not on file  . Sexual Activity: Not on file   Other Topics Concern  . Not on file   Social History Narrative   Worked as a Recruitment consultant.     Family History  Problem Relation Age of Onset  . Coronary artery disease      family hx     Review of Systems:  As stated in the HPI and otherwise negative.   BP 120/68 mmHg  Pulse 62  Ht 5\' 7"  (1.702 m)  Wt 182 lb (82.555 kg)  BMI 28.50 kg/m2  SpO2 98%  Physical Examination: General: Well developed, well nourished, NAD HEENT: OP clear, mucus membranes moist SKIN: warm, dry. No rashes. Neuro: No focal deficits Musculoskeletal: Muscle strength 5/5 all ext Psychiatric: Mood and affect normal Neck: No JVD, no carotid bruits, no thyromegaly, no lymphadenopathy. Lungs:Clear bilaterally, no wheezes, rhonci, crackles Cardiovascular: Regular rate and rhythm. No murmurs, gallops or rubs. Abdomen:Soft. Bowel sounds present. Non-tender.  Extremities: No lower extremity edema. Pulses are 2 + in the bilateral DP/PT.  Echo 03/02/12: The study was technically difficult, as a result of poor sound wave transmission. -  Left ventricle: The cavity size was normal. Wall thickness was increased in a pattern of mild LVH. Systolic function was normal. The estimated ejection fraction was in the range of 50% to 55%. - Left atrium: The atrium was moderately dilated.  Stress myoview 11/10/13: Raw Data Images: There is notably increased hepatic tracer  uptake that partially obscures the inferoseptal wall.   Stress Images: There is decreased uptake in the anterior wall. A moderate sized,medium intensity, fixed perfusion defect in the mid to apical anterior-apical wall. There is decreased uptake in the inferior wall. There is decreased uptake in the septum. A  large sized, severe intensity, fixed perfusion defect in the  entire inferior-inferoseptal (basal to apical). There is  decreased uptake  in the apex. No Reversibility is seen.  Rest Images: There is decreased uptake in the anterior wall.  There is decreased uptake in the inferior wall. There is  decreased uptake in the septum. Comparison with the stress images reveals no significant change.  LV Wall Motion: Gated images confirm essentially severe  hypokinesis to akinesis of the inferior-inferoseptal wall and at  least moderate anterior hypokinesis -- consistent with prior  infarctions in the LAD & RCA distribution.  Subtraction (SDS): There appear to be 2 separate fixed defects with significant correlating wall motion abnormalities that are  most consistent with a previous infarctions. No reversibility is  appreciated. No evidence of ischemia.   Impression Exercise Capacity: Lexiscan with no exercise. BP Response: Normal blood pressure response. Clinical Symptoms: There is dyspnea. ECG Impression: No significant ECG changes with Lexiscan. Comparison with Prior Nuclear Study: No images to compare  Overall Impression: Intermediate risk stress nuclear study with  2 separate defects consistent with signficant sized infarctions  with moderately reduced  LVEF..  Cardiac cath 11/23/13:  Left main: Mild non-obstructive disease.  Left Anterior Descending Artery: Large caliber vessel that courses to the apex. There is diffuse calcification and serial 30% stenoses in the proximal and mid LAD. There appears to be a stent in the proximal LAD with no restenosis. There is a small to moderate caliber bifurcating diagonal branch with 70% stenosis just prior to the bifurcation, unchanged from last cath.  Circumflex Artery: Large caliber vessel with large bifurcating obtuse marginal branch. The inferior sub-branch of the OM system has a 95% stenosis. The AV groove Circumflex tapers to a small caliber vessel.  Right Coronary Artery: Large dominant vessel with diffuse calcific 30% stenoses in the proximal, mid and distal vessel.   EKG:  EKG is ordered today. The ekg ordered today demonstrates Atrial fib, LBBB. Rate 66 bpm  Recent Labs: 07/03/2014: ALT 12; BUN 13; Creatinine, Ser 1.4; Hemoglobin 14.2; Platelets 152.0; Potassium 3.7; Sodium 138; TSH 0.97   Lipid Panel    Component Value Date/Time   CHOL 145 07/03/2014 1458   TRIG 118.0 07/03/2014 1458   HDL 52.00 07/03/2014 1458   CHOLHDL 3 07/03/2014 1458   VLDL 23.6 07/03/2014 1458   LDLCALC 69 07/03/2014 1458     Wt Readings from Last 3 Encounters:  06/21/15 182 lb (82.555 kg)  12/07/14 177 lb 12.8 oz (80.65 kg)  07/03/14 182 lb 1.9 oz (82.609 kg)     Other studies Reviewed: Additional studies/ records that were reviewed today include: . Review of the above records demonstrates:    Assessment and Plan:   1. ATRIAL FIBRILLATION, paroxsymal: Atrial fib today. Continue amiodarone and beta blocker. Continue coumadin. He has eye exams every 3 months.       2. CAD: Stable. Recent PCI/DES OM1 11/23/13. Continue Plavix. No ASA since he is on coumadin.   3. HTN: BP well controlled. No changes.   4. PAD: Stable. No claudication. Followed by Dr. Gwenlyn Found  5. Ischemic cardiomyopathy: LVEF=39% by nuclear  stress test. Continue medical management.  Repeat echo now.   6. Acute on chronic systolic/diastolic CHF: Will use Lasix 40 mg BID for 3 days, elevate legs and cut back on salt intake. He will call if LE edema does not resolve.   Current medicines are reviewed at length with the patient today.  The patient does not have concerns regarding medicines.  The following changes have been made:  no change  Labs/ tests ordered today include:  Orders Placed This Encounter  Procedures  . Echocardiogram    Disposition:   FU with me in 6 weeks  Signed, Lauree Chandler, MD 06/21/2015 5:07 PM    [facility] [address], [place], [place]  [address] Phone: [phone]; Fax: [phone]

## 2015-06-21 ENCOUNTER — Ambulatory Visit (INDEPENDENT_AMBULATORY_CARE_PROVIDER_SITE_OTHER): Payer: Medicare Other | Admitting: Cardiovascular Disease

## 2015-06-21 ENCOUNTER — Encounter: Payer: Self-pay | Admitting: Cardiovascular Disease

## 2015-06-21 VITALS — BP 120/68 | HR 62 | Ht 67.0 in | Wt 182.0 lb

## 2015-06-21 DIAGNOSIS — I255 Ischemic cardiomyopathy: Secondary | ICD-10-CM

## 2015-06-21 DIAGNOSIS — I10 Essential (primary) hypertension: Secondary | ICD-10-CM

## 2015-06-21 DIAGNOSIS — I2510 Atherosclerotic heart disease of native coronary artery without angina pectoris: Secondary | ICD-10-CM | POA: Diagnosis not present

## 2015-06-21 DIAGNOSIS — I6350 Cerebral infarction due to unspecified occlusion or stenosis of unspecified cerebral artery: Secondary | ICD-10-CM | POA: Diagnosis not present

## 2015-06-21 DIAGNOSIS — I480 Paroxysmal atrial fibrillation: Secondary | ICD-10-CM | POA: Diagnosis not present

## 2015-06-21 DIAGNOSIS — I5043 Acute on chronic combined systolic (congestive) and diastolic (congestive) heart failure: Secondary | ICD-10-CM

## 2015-06-21 NOTE — Patient Instructions (Addendum)
Medication Instructions:  Your physician has recommended you make the following change in your medication: Increase furosemide to twice daily for 3 days and then return to normal dose    Labwork: none  Testing/Procedures: Your physician has requested that you have an echocardiogram. Echocardiography is a painless test that uses sound waves to create images of your heart. It provides your doctor with information about the size and shape of your heart and how well your heart's chambers and valves are working. This procedure takes approximately one hour. There are no restrictions for this procedure.    Follow-Up: Your physician recommends that you schedule a follow-up appointment in: 6-8 weeks. --Scheduled for January 19,2017 at 3:00    Any Other Special Instructions Will Be Listed Below (If Applicable).     If you need a refill on your cardiac medications before your next appointment, please call your pharmacy.

## 2015-07-02 ENCOUNTER — Other Ambulatory Visit: Payer: Self-pay

## 2015-07-02 MED ORDER — HYDRALAZINE HCL 50 MG PO TABS
50.0000 mg | ORAL_TABLET | Freq: Three times a day (TID) | ORAL | Status: DC
Start: 2015-07-02 — End: 2016-05-21

## 2015-07-02 NOTE — Telephone Encounter (Signed)
Burnell Blanks, MD at 06/20/2015 2:03 PM  hydrALAZINE (APRESOLINE) 50 MG tabletTake 1 tablet (50 mg total) by mouth 3 (three) times daily Current medicines are reviewed at length with the patient today. The patient does not have concerns regarding medicines.  The following changes have been made: no change

## 2015-07-05 ENCOUNTER — Ambulatory Visit (HOSPITAL_COMMUNITY): Payer: Medicare Other | Attending: Cardiovascular Disease

## 2015-07-05 ENCOUNTER — Other Ambulatory Visit: Payer: Self-pay

## 2015-07-05 ENCOUNTER — Ambulatory Visit (INDEPENDENT_AMBULATORY_CARE_PROVIDER_SITE_OTHER): Payer: Medicare Other | Admitting: Pharmacist

## 2015-07-05 DIAGNOSIS — I509 Heart failure, unspecified: Secondary | ICD-10-CM | POA: Diagnosis present

## 2015-07-05 DIAGNOSIS — R29898 Other symptoms and signs involving the musculoskeletal system: Secondary | ICD-10-CM | POA: Diagnosis not present

## 2015-07-05 DIAGNOSIS — I2510 Atherosclerotic heart disease of native coronary artery without angina pectoris: Secondary | ICD-10-CM

## 2015-07-05 DIAGNOSIS — I5189 Other ill-defined heart diseases: Secondary | ICD-10-CM | POA: Insufficient documentation

## 2015-07-05 DIAGNOSIS — I6350 Cerebral infarction due to unspecified occlusion or stenosis of unspecified cerebral artery: Secondary | ICD-10-CM

## 2015-07-05 DIAGNOSIS — Z7901 Long term (current) use of anticoagulants: Secondary | ICD-10-CM

## 2015-07-05 DIAGNOSIS — I253 Aneurysm of heart: Secondary | ICD-10-CM | POA: Insufficient documentation

## 2015-07-05 DIAGNOSIS — I4891 Unspecified atrial fibrillation: Secondary | ICD-10-CM | POA: Diagnosis not present

## 2015-07-05 DIAGNOSIS — I255 Ischemic cardiomyopathy: Secondary | ICD-10-CM

## 2015-07-05 DIAGNOSIS — I517 Cardiomegaly: Secondary | ICD-10-CM | POA: Diagnosis not present

## 2015-07-05 DIAGNOSIS — Z8673 Personal history of transient ischemic attack (TIA), and cerebral infarction without residual deficits: Secondary | ICD-10-CM

## 2015-07-05 DIAGNOSIS — I5043 Acute on chronic combined systolic (congestive) and diastolic (congestive) heart failure: Secondary | ICD-10-CM

## 2015-07-05 DIAGNOSIS — I340 Nonrheumatic mitral (valve) insufficiency: Secondary | ICD-10-CM | POA: Insufficient documentation

## 2015-07-05 LAB — POCT INR: INR: 2.2

## 2015-07-09 ENCOUNTER — Encounter: Payer: Self-pay | Admitting: Podiatry

## 2015-07-09 ENCOUNTER — Ambulatory Visit (INDEPENDENT_AMBULATORY_CARE_PROVIDER_SITE_OTHER): Payer: Medicare Other | Admitting: Podiatry

## 2015-07-09 DIAGNOSIS — B351 Tinea unguium: Secondary | ICD-10-CM

## 2015-07-09 DIAGNOSIS — M79673 Pain in unspecified foot: Secondary | ICD-10-CM | POA: Diagnosis not present

## 2015-07-09 NOTE — Progress Notes (Signed)
Patient ID: [name], male   DOB: Mar 18, 1927, 81 y.o.   MRN: [id number] Complaint:  Visit Type: Patient returns to my office for continued preventative foot care services. Complaint: Patient states" my nails have grown long and thick and become painful to walk and wear shoes" Patient has hx of CVA. He presents for preventative foot care services. No changes to ROS  Podiatric Exam: Vascular: dorsalis pedis are palpable B/L.   Posterior tibial pulses are palpable not  bilateral. Capillary return is immediate. Temperature gradient is diminished.. Skin turgor WNL  Sensorium: Normal Semmes Weinstein monofilament test. Normal tactile sensation bilaterally. Nail Exam: Pt has thick disfigured discolored nails with subungual debris noted bilateral entire nail hallux through fifth toenails Ulcer Exam: There is no evidence of ulcer or pre-ulcerative changes or infection. Orthopedic Exam: Muscle tone and strength are WNL. No limitations in general ROM. No crepitus or effusions noted. Foot type and digits show no abnormalities. Bony prominences are unremarkable. Skin: No Porokeratosis. No infection or ulcers  Diagnosis:  Tinea unguium, Pain in right toe, pain in left toes  Treatment & Plan Procedures and Treatment: Consent by patient was obtained for treatment procedures. The patient understood the discussion of treatment and procedures well. All questions were answered thoroughly reviewed. Debridement of mycotic and hypertrophic toenails, 1 through 5 bilateral and clearing of subungual debris. No ulceration, no infection noted.  Return Visit-Office Procedure: Patient instructed to return to the office for a follow up visit 3 months for continued evaluation and treatment.   Gardiner Barefoot DPM

## 2015-08-07 NOTE — Progress Notes (Signed)
Chief Complaint  Patient presents with  . Chest Pain     History of Present Illness: 82 yo male with h/o CAD, paroxysmal atrial fibrillation, CVA, diastolic CHF, HTN, HLD, CRI, PAD here today for cardiac follow up. He has been followed in the past by Dr. Olevia Perches. He has had multiple PCIs. He also has paroxysmal atrial fibrillation which is treated with amiodarone and Coumadin. He had a previous stroke with left-sided weakness a number of years ago. He walks with a cane. He has a history of diastolic CHF and his last ejection fraction was 50-55% by echo August 2013. ABI October 2014 were hard to obtain but TBI normal. PAD is followed by Dr. Gwenlyn Found. I saw him April 2015 and he had c/o chest pain. Stress myoview with ischemia. LVEF=39%. Cardiac cath 11/23/13 with severe stenosis OM1 treated with 2.5 x 12 mm Xience DES.  Last echo December 2016 with LVEF=40-45%.   He is here today for cardiac follow up.   He has rare chest pains. These last for a few minutes. No dyspnea. He has not used NTG. Overall doing well.   Primary Care Physician: Lucianne Lei  Last Lipid Profile: Followed in primary care.   Past Medical History  Diagnosis Date  . Hx of adenomatous colonic polyps     hx?  . Diverticulosis of colon   . MI (myocardial infarction) ([place])   . Cellulitis     arm  . BPH (benign prostatic hypertrophy)     hx of; s/p TURP  . Venous insufficiency of leg     lower extremity edema; probably an element of diastolic heart failure and this was previously aggrevated by Norvasc  . HLD (hyperlipidemia)   . HTN (hypertension)   . CVA (cerebral vascular accident) ([place])     hx  . Atrial fibrillation (HCC)     embolic stroke secondary to a-fi; paroxysmal a-fib, controlled on amiodarone and coumadin    . CHF (congestive heart failure) ([place])   . Ischemic cardiomyopathy   . CAD (coronary artery disease)     s/p remote diaphragmatic wall infarction and multiple percutaneous coronary interventions. EF 50%    . Chronic renal insufficiency     creatinine 1.9   . Claudication [facility])     Past Surgical History  Procedure Laterality Date  . Turp  1989  . Angioplasty/stent placement    . Lower extremity arterial doppler  12/01/2011    Bilateral ABIs-not accessible due to calcified vessels. Distal Abdominal Aorta-demonstrated aneurysmal dilatation with a measurement of 2.87x3.32cm. Right runoff-anterior tibial artery into dorsalis pedis appears to demonstrate occlusive disease. Left SFA demonstrates 0-49%. Left runoff-anterior tibial artery demonstrates occlusive disease  . Left heart catheterization with coronary angiogram N/A 11/23/2013    Procedure: LEFT HEART CATHETERIZATION WITH CORONARY ANGIOGRAM;  Surgeon: Burnell Blanks, MD;  Location: [facility] CATH LAB;  Service: Cardiovascular;  Laterality: N/A;  . Percutaneous coronary stent intervention (pci-s)  11/23/2013    Procedure: PERCUTANEOUS CORONARY STENT INTERVENTION (PCI-S);  Surgeon: Burnell Blanks, MD;  Location: [facility] CATH LAB;  Service: Cardiovascular;;    Current Outpatient Prescriptions  Medication Sig Dispense Refill  . amiodarone (PACERONE) 200 MG tablet Take 0.5 tablets (100 mg total) by mouth daily. 45 tablet 1  . clopidogrel (PLAVIX) 75 MG tablet Take 1 tablet (75 mg total) by mouth daily with breakfast. 90 tablet 2  . D3-50 50000 UNITS capsule Take 500,000 Units by mouth daily.   1  . furosemide (LASIX)  40 MG tablet Take 1 tablet (40 mg total) by mouth daily. 90 tablet 2  . hydrALAZINE (APRESOLINE) 50 MG tablet Take 1 tablet (50 mg total) by mouth 3 (three) times daily. 270 tablet 2  . loratadine (ALLERGY RELIEF) 10 MG dissolvable tablet Take 10 mg by mouth daily as needed for allergies.     . magnesium oxide (MAG-OX) 400 MG tablet Take 400 mg by mouth 2 (two) times daily.     . metoprolol succinate (TOPROL-XL) 50 MG 24 hr tablet Take 1 tablet (50 mg total) by mouth 2 (two) times daily. Take with or immediately following a meal.  180 tablet 3  . nitroGLYCERIN (NITROSTAT) 0.4 MG SL tablet Place 1 tablet (0.4 mg total) under the tongue every 5 (five) minutes as needed for chest pain (MAX 3 TABLETS). 25 tablet 6  . pravastatin (PRAVACHOL) 20 MG tablet Take 1 tablet (20 mg total) by mouth at bedtime. 90 tablet 3  . ramipril (ALTACE) 10 MG capsule TAKE 1 CAPSULE BY MOUTH DAILY 90 capsule 2  . warfarin (COUMADIN) 2 MG tablet Take 1-1.5 tablets (2-3 mg total) by mouth as directed. 140 tablet 1   No current facility-administered medications for this visit.    No Known Allergies  Social History   Social History  . Marital Status: Widowed    Spouse Name: N/A  . Number of Children: N/A  . Years of Education: N/A   Occupational History  . Not on file.   Social History Main Topics  . Smoking status: Former Research scientist (life sciences)  . Smokeless tobacco: Never Used     Comment: nonsmoker  . Alcohol Use: No  . Drug Use: Not on file  . Sexual Activity: Not on file   Other Topics Concern  . Not on file   Social History Narrative   Worked as a Recruitment consultant.     Family History  Problem Relation Age of Onset  . Coronary artery disease      family hx     Review of Systems:  As stated in the HPI and otherwise negative.   BP 100/70 mmHg  Pulse 68  Ht 5\' 7"  (1.702 m)  Wt 181 lb 12.8 oz (82.464 kg)  BMI 28.47 kg/m2  SpO2 98%  Physical Examination: General: Well developed, well nourished, NAD HEENT: OP clear, mucus membranes moist SKIN: warm, dry. No rashes. Neuro: No focal deficits Musculoskeletal: Muscle strength 5/5 all ext Psychiatric: Mood and affect normal Neck: No JVD, no carotid bruits, no thyromegaly, no lymphadenopathy. Lungs:Clear bilaterally, no wheezes, rhonci, crackles Cardiovascular: Regular rate and rhythm. No murmurs, gallops or rubs. Abdomen:Soft. Bowel sounds present. Non-tender.  Extremities: No lower extremity edema. Pulses are 2 + in the bilateral DP/PT.  Echo December 2016:  Left ventricle: The  cavity size was normal. Wall thickness was normal. Systolic function was mildly to moderately reduced. The estimated ejection fraction was in the range of 40% to 45%. There is akinesis of the inferolateral myocardium. There is hypokinesis of the septal myocardium. Doppler parameters are consistent with abnormal left ventricular relaxation (grade 1 diastolic dysfunction). - Mitral valve: There was mild regurgitation. - Left atrium: The atrium was mildly dilated. - Atrial septum: There was an atrial septal aneurysm.  Impressions:  - Technically difficult; there appears to be septal hypokinesis and akinesis of the basal inferior and inferior lateral wall with overall mild to moderate LV dysfunction; grade 1 diastolic dysfunction; mild MR; mild LAE.   Stress myoview 11/10/13: Raw Data Images:  There is notably increased hepatic tracer  uptake that partially obscures the inferoseptal wall.   Stress Images: There is decreased uptake in the anterior wall. A moderate sized,medium intensity, fixed perfusion defect in the mid to apical anterior-apical wall. There is decreased uptake in the inferior wall. There is decreased uptake in the septum. A  large sized, severe intensity, fixed perfusion defect in the  entire inferior-inferoseptal (basal to apical). There is  decreased uptake in the apex. No Reversibility is seen.  Rest Images: There is decreased uptake in the anterior wall.  There is decreased uptake in the inferior wall. There is  decreased uptake in the septum. Comparison with the stress images reveals no significant change.  LV Wall Motion: Gated images confirm essentially severe  hypokinesis to akinesis of the inferior-inferoseptal wall and at  least moderate anterior hypokinesis -- consistent with prior  infarctions in the LAD & RCA distribution.  Subtraction (SDS): There appear to be 2 separate fixed defects with significant correlating wall motion  abnormalities that are  most consistent with a previous infarctions. No reversibility is  appreciated. No evidence of ischemia.   Impression Exercise Capacity: Lexiscan with no exercise. BP Response: Normal blood pressure response. Clinical Symptoms: There is dyspnea. ECG Impression: No significant ECG changes with Lexiscan. Comparison with Prior Nuclear Study: No images to compare  Overall Impression: Intermediate risk stress nuclear study with  2 separate defects consistent with signficant sized infarctions  with moderately reduced LVEF..  Cardiac cath 11/23/13:  Left main: Mild non-obstructive disease.  Left Anterior Descending Artery: Large caliber vessel that courses to the apex. There is diffuse calcification and serial 30% stenoses in the proximal and mid LAD. There appears to be a stent in the proximal LAD with no restenosis. There is a small to moderate caliber bifurcating diagonal branch with 70% stenosis just prior to the bifurcation, unchanged from last cath.  Circumflex Artery: Large caliber vessel with large bifurcating obtuse marginal branch. The inferior sub-branch of the OM system has a 95% stenosis. The AV groove Circumflex tapers to a small caliber vessel.  Right Coronary Artery: Large dominant vessel with diffuse calcific 30% stenoses in the proximal, mid and distal vessel.   EKG:  EKG is ordered today. The ekg ordered today demonstrates Atrial fib, LBBB. Rate 66 bpm  Recent Labs: No results found for requested labs within last 365 days.   Lipid Panel    Component Value Date/Time   CHOL 145 07/03/2014 1458   TRIG 118.0 07/03/2014 1458   HDL 52.00 07/03/2014 1458   CHOLHDL 3 07/03/2014 1458   VLDL 23.6 07/03/2014 1458   LDLCALC 69 07/03/2014 1458     Wt Readings from Last 3 Encounters:  08/08/15 181 lb 12.8 oz (82.464 kg)  06/21/15 182 lb (82.555 kg)  12/07/14 177 lb 12.8 oz (80.65 kg)     Other studies Reviewed: Additional studies/ records that were  reviewed today include: . Review of the above records demonstrates:    Assessment and Plan:   1. ATRIAL FIBRILLATION, paroxsymal: Sinus today. Continue amiodarone and beta blocker. Continue coumadin. He has eye exams every 3 months.       2. CAD: Stable. Most recent PCI/DES OM1 11/23/13. Continue Plavix. No ASA since he is on coumadin.   3. HTN: BP well controlled. No changes.   4. PAD: Stable. No claudication. Followed by Dr. Gwenlyn Found  5. Ischemic cardiomyopathy: LVEF=40-45% by echo December 2016. Continue medical management.     6. Acute on  chronic systolic/diastolic CHF: Continue Lasix 40 mg daily.   Current medicines are reviewed at length with the patient today.  The patient does not have concerns regarding medicines.  The following changes have been made:  no change  Labs/ tests ordered today include:   No orders of the defined types were placed in this encounter.    Disposition:   FU with me in 6 months  Signed, Lauree Chandler, MD 08/08/2015 10:02 PM    [facility] [place], [place], [place]  [address] Phone: [phone]; Fax: [phone]

## 2015-08-08 ENCOUNTER — Encounter: Payer: Self-pay | Admitting: Cardiovascular Disease

## 2015-08-08 ENCOUNTER — Ambulatory Visit (INDEPENDENT_AMBULATORY_CARE_PROVIDER_SITE_OTHER): Payer: Medicare Other | Admitting: Cardiovascular Disease

## 2015-08-08 VITALS — BP 100/70 | HR 68 | Ht 67.0 in | Wt 181.8 lb

## 2015-08-08 DIAGNOSIS — I255 Ischemic cardiomyopathy: Secondary | ICD-10-CM | POA: Diagnosis not present

## 2015-08-08 DIAGNOSIS — I480 Paroxysmal atrial fibrillation: Secondary | ICD-10-CM | POA: Diagnosis not present

## 2015-08-08 DIAGNOSIS — I2510 Atherosclerotic heart disease of native coronary artery without angina pectoris: Secondary | ICD-10-CM

## 2015-08-08 NOTE — Patient Instructions (Signed)

## 2015-08-16 ENCOUNTER — Ambulatory Visit (INDEPENDENT_AMBULATORY_CARE_PROVIDER_SITE_OTHER): Payer: Medicare Other | Admitting: *Deleted

## 2015-08-16 DIAGNOSIS — Z8673 Personal history of transient ischemic attack (TIA), and cerebral infarction without residual deficits: Secondary | ICD-10-CM

## 2015-08-16 DIAGNOSIS — Z7901 Long term (current) use of anticoagulants: Secondary | ICD-10-CM | POA: Diagnosis not present

## 2015-08-16 LAB — POCT INR: INR: 2.8

## 2015-08-28 ENCOUNTER — Telehealth: Payer: Self-pay | Admitting: Cardiovascular Disease

## 2015-08-28 NOTE — Telephone Encounter (Signed)
Walk in pt form-Handicapped Pla-Card-paper dropped off Pat back Thursday

## 2015-08-29 NOTE — Telephone Encounter (Signed)
I spoke with pt and told him paperwork has been completed. Will mail to him at his request.

## 2015-09-04 ENCOUNTER — Telehealth: Payer: Self-pay | Admitting: Cardiovascular Disease

## 2015-09-04 NOTE — Telephone Encounter (Signed)
Follow Up   Pt called request a call back to see if the forms were mailed off. He states he hasn't received them. If they werent mailed pt states that he will come to pick them up. shanti

## 2015-09-04 NOTE — Telephone Encounter (Signed)
Spoke with pt and told him handicap placard paperwork was mailed to his house.  He will let us know if he does not receive in a few days.

## 2015-09-27 ENCOUNTER — Ambulatory Visit (INDEPENDENT_AMBULATORY_CARE_PROVIDER_SITE_OTHER): Payer: Medicare Other | Admitting: Pharmacist

## 2015-09-27 DIAGNOSIS — Z7901 Long term (current) use of anticoagulants: Secondary | ICD-10-CM

## 2015-09-27 DIAGNOSIS — I4891 Unspecified atrial fibrillation: Secondary | ICD-10-CM | POA: Diagnosis not present

## 2015-09-27 DIAGNOSIS — Z8673 Personal history of transient ischemic attack (TIA), and cerebral infarction without residual deficits: Secondary | ICD-10-CM

## 2015-09-27 DIAGNOSIS — I6350 Cerebral infarction due to unspecified occlusion or stenosis of unspecified cerebral artery: Secondary | ICD-10-CM

## 2015-09-27 LAB — POCT INR: INR: 2.8

## 2015-10-07 ENCOUNTER — Other Ambulatory Visit: Payer: Self-pay | Admitting: Cardiovascular Disease

## 2015-10-08 ENCOUNTER — Ambulatory Visit (INDEPENDENT_AMBULATORY_CARE_PROVIDER_SITE_OTHER): Payer: Medicare Other | Admitting: Podiatry

## 2015-10-08 ENCOUNTER — Encounter: Payer: Self-pay | Admitting: Podiatry

## 2015-10-08 DIAGNOSIS — B351 Tinea unguium: Secondary | ICD-10-CM | POA: Diagnosis not present

## 2015-10-08 DIAGNOSIS — M79673 Pain in unspecified foot: Secondary | ICD-10-CM

## 2015-10-08 NOTE — Progress Notes (Signed)
Patient ID: [name], male   DOB: Mar 18, 1927, 82 y.o.   MRN: [id number] Complaint:  Visit Type: Patient returns to my office for continued preventative foot care services. Complaint: Patient states" my nails have grown long and thick and become painful to walk and wear shoes" Patient has hx of CVA. He presents for preventative foot care services. No changes to ROS  Podiatric Exam: Vascular: dorsalis pedis are palpable B/L.   Posterior tibial pulses are palpable not  bilateral. Capillary return is immediate. Temperature gradient is diminished.. Skin turgor WNL  Sensorium: Normal Semmes Weinstein monofilament test. Normal tactile sensation bilaterally. Nail Exam: Pt has thick disfigured discolored nails with subungual debris noted bilateral entire nail hallux through fifth toenails Ulcer Exam: There is no evidence of ulcer or pre-ulcerative changes or infection. Orthopedic Exam: Muscle tone and strength are WNL. No limitations in general ROM. No crepitus or effusions noted. Foot type and digits show no abnormalities. Bony prominences are unremarkable. Skin: No Porokeratosis. No infection or ulcers  Diagnosis:  Tinea unguium, Pain in right toe, pain in left toes  Treatment & Plan Procedures and Treatment: Consent by patient was obtained for treatment procedures. The patient understood the discussion of treatment and procedures well. All questions were answered thoroughly reviewed. Debridement of mycotic and hypertrophic toenails, 1 through 5 bilateral and clearing of subungual debris. No ulceration, no infection noted.  Return Visit-Office Procedure: Patient instructed to return to the office for a follow up visit 3 months for continued evaluation and treatment.   Gardiner Barefoot DPM

## 2015-11-08 ENCOUNTER — Ambulatory Visit (INDEPENDENT_AMBULATORY_CARE_PROVIDER_SITE_OTHER): Payer: Medicare Other

## 2015-11-08 DIAGNOSIS — Z7901 Long term (current) use of anticoagulants: Secondary | ICD-10-CM

## 2015-11-08 DIAGNOSIS — Z8673 Personal history of transient ischemic attack (TIA), and cerebral infarction without residual deficits: Secondary | ICD-10-CM

## 2015-11-08 DIAGNOSIS — I4891 Unspecified atrial fibrillation: Secondary | ICD-10-CM | POA: Diagnosis not present

## 2015-11-08 DIAGNOSIS — I6350 Cerebral infarction due to unspecified occlusion or stenosis of unspecified cerebral artery: Secondary | ICD-10-CM | POA: Diagnosis not present

## 2015-11-08 LAB — POCT INR: INR: 3.2

## 2015-11-13 ENCOUNTER — Other Ambulatory Visit: Payer: Self-pay | Admitting: Cardiovascular Disease

## 2015-12-06 ENCOUNTER — Ambulatory Visit (INDEPENDENT_AMBULATORY_CARE_PROVIDER_SITE_OTHER): Payer: Medicare Other | Admitting: Pharmacist

## 2015-12-06 DIAGNOSIS — Z8673 Personal history of transient ischemic attack (TIA), and cerebral infarction without residual deficits: Secondary | ICD-10-CM

## 2015-12-06 DIAGNOSIS — I4891 Unspecified atrial fibrillation: Secondary | ICD-10-CM

## 2015-12-06 DIAGNOSIS — I6350 Cerebral infarction due to unspecified occlusion or stenosis of unspecified cerebral artery: Secondary | ICD-10-CM

## 2015-12-06 DIAGNOSIS — Z7901 Long term (current) use of anticoagulants: Secondary | ICD-10-CM

## 2015-12-06 LAB — POCT INR: INR: 2.8

## 2016-01-07 ENCOUNTER — Ambulatory Visit (INDEPENDENT_AMBULATORY_CARE_PROVIDER_SITE_OTHER): Payer: Medicare Other | Admitting: Podiatry

## 2016-01-07 ENCOUNTER — Encounter: Payer: Self-pay | Admitting: Podiatry

## 2016-01-07 DIAGNOSIS — B351 Tinea unguium: Secondary | ICD-10-CM

## 2016-01-07 DIAGNOSIS — M79673 Pain in unspecified foot: Secondary | ICD-10-CM | POA: Diagnosis not present

## 2016-01-07 NOTE — Progress Notes (Signed)
Patient ID: JOSHAWA LAPIETRA, male   DOB: Mar 18, 1927, 82 y.o.   MRN: [id number] Complaint:  Visit Type: Patient returns to my office for continued preventative foot care services. Complaint: Patient states" my nails have grown long and thick and become painful to walk and wear shoes" Patient has hx of CVA. He presents for preventative foot care services. No changes to ROS  Podiatric Exam: Vascular: dorsalis pedis are palpable B/L.   Posterior tibial pulses are palpable not  bilateral. Capillary return is immediate. Temperature gradient is diminished.. Skin turgor WNL  Sensorium: Normal Semmes Weinstein monofilament test. Normal tactile sensation bilaterally. Nail Exam: Pt has thick disfigured discolored nails with subungual debris noted bilateral entire nail hallux through fifth toenails Ulcer Exam: There is no evidence of ulcer or pre-ulcerative changes or infection. Orthopedic Exam: Muscle tone and strength are WNL. No limitations in general ROM. No crepitus or effusions noted. Foot type and digits show no abnormalities. Bony prominences are unremarkable. Skin: No Porokeratosis. No infection or ulcers  Diagnosis:  Tinea unguium, Pain in right toe, pain in left toes  Treatment & Plan Procedures and Treatment: Consent by patient was obtained for treatment procedures. The patient understood the discussion of treatment and procedures well. All questions were answered thoroughly reviewed. Debridement of mycotic and hypertrophic toenails, 1 through 5 bilateral and clearing of subungual debris. No ulceration, no infection noted.  Return Visit-Office Procedure: Patient instructed to return to the office for a follow up visit 3 months for continued evaluation and treatment.   Gardiner Barefoot DPM

## 2016-01-11 ENCOUNTER — Other Ambulatory Visit: Payer: Self-pay | Admitting: Cardiovascular Disease

## 2016-01-17 ENCOUNTER — Ambulatory Visit (INDEPENDENT_AMBULATORY_CARE_PROVIDER_SITE_OTHER): Payer: Medicare Other | Admitting: *Deleted

## 2016-01-17 DIAGNOSIS — Z7901 Long term (current) use of anticoagulants: Secondary | ICD-10-CM | POA: Diagnosis not present

## 2016-01-17 DIAGNOSIS — I6350 Cerebral infarction due to unspecified occlusion or stenosis of unspecified cerebral artery: Secondary | ICD-10-CM

## 2016-01-17 DIAGNOSIS — I4891 Unspecified atrial fibrillation: Secondary | ICD-10-CM | POA: Diagnosis not present

## 2016-01-17 DIAGNOSIS — Z8673 Personal history of transient ischemic attack (TIA), and cerebral infarction without residual deficits: Secondary | ICD-10-CM

## 2016-01-17 LAB — POCT INR: INR: 3

## 2016-02-03 ENCOUNTER — Other Ambulatory Visit: Payer: Self-pay | Admitting: Cardiovascular Disease

## 2016-02-17 ENCOUNTER — Other Ambulatory Visit: Payer: Self-pay | Admitting: Cardiovascular Disease

## 2016-02-28 ENCOUNTER — Ambulatory Visit (INDEPENDENT_AMBULATORY_CARE_PROVIDER_SITE_OTHER): Payer: Medicare Other

## 2016-02-28 ENCOUNTER — Other Ambulatory Visit: Payer: Self-pay | Admitting: Cardiovascular Disease

## 2016-02-28 DIAGNOSIS — Z7901 Long term (current) use of anticoagulants: Secondary | ICD-10-CM

## 2016-02-28 DIAGNOSIS — I6350 Cerebral infarction due to unspecified occlusion or stenosis of unspecified cerebral artery: Secondary | ICD-10-CM

## 2016-02-28 DIAGNOSIS — Z8673 Personal history of transient ischemic attack (TIA), and cerebral infarction without residual deficits: Secondary | ICD-10-CM | POA: Diagnosis not present

## 2016-02-28 DIAGNOSIS — I4891 Unspecified atrial fibrillation: Secondary | ICD-10-CM | POA: Diagnosis not present

## 2016-02-28 LAB — POCT INR: INR: 3.2

## 2016-03-17 ENCOUNTER — Other Ambulatory Visit: Payer: Self-pay

## 2016-03-27 ENCOUNTER — Ambulatory Visit (INDEPENDENT_AMBULATORY_CARE_PROVIDER_SITE_OTHER): Payer: Medicare Other | Admitting: *Deleted

## 2016-03-27 DIAGNOSIS — I4891 Unspecified atrial fibrillation: Secondary | ICD-10-CM

## 2016-03-27 DIAGNOSIS — Z8673 Personal history of transient ischemic attack (TIA), and cerebral infarction without residual deficits: Secondary | ICD-10-CM

## 2016-03-27 DIAGNOSIS — Z7901 Long term (current) use of anticoagulants: Secondary | ICD-10-CM | POA: Diagnosis not present

## 2016-03-27 DIAGNOSIS — I6350 Cerebral infarction due to unspecified occlusion or stenosis of unspecified cerebral artery: Secondary | ICD-10-CM

## 2016-03-27 LAB — POCT INR: INR: 3.1

## 2016-04-10 ENCOUNTER — Ambulatory Visit (INDEPENDENT_AMBULATORY_CARE_PROVIDER_SITE_OTHER): Payer: Medicare Other | Admitting: *Deleted

## 2016-04-10 DIAGNOSIS — I4891 Unspecified atrial fibrillation: Secondary | ICD-10-CM

## 2016-04-10 DIAGNOSIS — Z8673 Personal history of transient ischemic attack (TIA), and cerebral infarction without residual deficits: Secondary | ICD-10-CM

## 2016-04-10 DIAGNOSIS — Z7901 Long term (current) use of anticoagulants: Secondary | ICD-10-CM

## 2016-04-10 DIAGNOSIS — I6350 Cerebral infarction due to unspecified occlusion or stenosis of unspecified cerebral artery: Secondary | ICD-10-CM | POA: Diagnosis not present

## 2016-04-10 LAB — POCT INR: INR: 2.4

## 2016-04-14 ENCOUNTER — Ambulatory Visit (INDEPENDENT_AMBULATORY_CARE_PROVIDER_SITE_OTHER): Payer: Medicare Other | Admitting: Podiatry

## 2016-04-14 ENCOUNTER — Encounter: Payer: Self-pay | Admitting: Podiatry

## 2016-04-14 VITALS — BP 131/58 | HR 61 | Resp 14

## 2016-04-14 DIAGNOSIS — B351 Tinea unguium: Secondary | ICD-10-CM

## 2016-04-14 DIAGNOSIS — M79673 Pain in unspecified foot: Secondary | ICD-10-CM | POA: Diagnosis not present

## 2016-04-14 NOTE — Progress Notes (Signed)
Patient ID: [name], male   DOB: 1927/05/25, 81 y.o.   MRN: [id number] Complaint:  Visit Type: Patient returns to my office for continued preventative foot care services. Complaint: Patient states" my nails have grown long and thick and become painful to walk and wear shoes" Patient has hx of CVA. He presents for preventative foot care services. No changes to ROS  Podiatric Exam: Vascular: dorsalis pedis are palpable B/L.   Posterior tibial pulses are palpable not  bilateral. Capillary return is immediate. Temperature gradient is diminished.. Skin turgor WNL  Sensorium: Normal Semmes Weinstein monofilament test. Normal tactile sensation bilaterally. Nail Exam: Pt has thick disfigured discolored nails with subungual debris noted bilateral entire nail hallux through fifth toenails Ulcer Exam: There is no evidence of ulcer or pre-ulcerative changes or infection. Orthopedic Exam: Muscle tone and strength are WNL. No limitations in general ROM. No crepitus or effusions noted. Foot type and digits show no abnormalities. Bony prominences are unremarkable. Skin: No Porokeratosis. No infection or ulcers  Diagnosis:  Tinea unguium, Pain in right toe, pain in left toes  Treatment & Plan Procedures and Treatment: Consent by patient was obtained for treatment procedures. The patient understood the discussion of treatment and procedures well. All questions were answered thoroughly reviewed. Debridement of mycotic and hypertrophic toenails, 1 through 5 bilateral and clearing of subungual debris. No ulceration, no infection noted.  Return Visit-Office Procedure: Patient instructed to return to the office for a follow up visit 3 months for continued evaluation and treatment.   Gardiner Barefoot DPM

## 2016-04-20 ENCOUNTER — Emergency Department (HOSPITAL_COMMUNITY)
Admission: EM | Admit: 2016-04-20 | Discharge: 2016-04-20 | Disposition: A | Discharge status: Home / Self Care | Payer: Medicare Other | Attending: Emergency Medicine | Admitting: Emergency Medicine

## 2016-04-20 ENCOUNTER — Encounter (HOSPITAL_COMMUNITY): Payer: Self-pay | Admitting: Emergency Medicine

## 2016-04-20 ENCOUNTER — Emergency Department (HOSPITAL_COMMUNITY): Payer: Medicare Other

## 2016-04-20 DIAGNOSIS — W1839XA Other fall on same level, initial encounter: Secondary | ICD-10-CM | POA: Insufficient documentation

## 2016-04-20 DIAGNOSIS — Y939 Activity, unspecified: Secondary | ICD-10-CM | POA: Diagnosis not present

## 2016-04-20 DIAGNOSIS — Z041 Encounter for examination and observation following transport accident: Secondary | ICD-10-CM | POA: Diagnosis present

## 2016-04-20 DIAGNOSIS — Y999 Unspecified external cause status: Secondary | ICD-10-CM | POA: Insufficient documentation

## 2016-04-20 DIAGNOSIS — Y92194 Driveway of other specified residential institution as the place of occurrence of the external cause: Secondary | ICD-10-CM | POA: Diagnosis not present

## 2016-04-20 DIAGNOSIS — I10 Essential (primary) hypertension: Secondary | ICD-10-CM | POA: Diagnosis not present

## 2016-04-20 DIAGNOSIS — W1830XA Fall on same level, unspecified, initial encounter: Secondary | ICD-10-CM

## 2016-04-20 DIAGNOSIS — R930 Abnormal findings on diagnostic imaging of skull and head, not elsewhere classified: Secondary | ICD-10-CM | POA: Insufficient documentation

## 2016-04-20 DIAGNOSIS — Z8673 Personal history of transient ischemic attack (TIA), and cerebral infarction without residual deficits: Secondary | ICD-10-CM | POA: Insufficient documentation

## 2016-04-20 HISTORY — DX: Cerebral infarction, unspecified: I63.9

## 2016-04-20 HISTORY — DX: Essential (primary) hypertension: I10

## 2016-04-20 LAB — PROTIME-INR
INR: 1.71
Prothrombin Time: 20.3 seconds — ABNORMAL HIGH (ref 11.4–15.2)
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
## 2016-04-21 ENCOUNTER — Encounter: Payer: Self-pay | Admitting: Podiatry

## 2016-05-01 ENCOUNTER — Ambulatory Visit (INDEPENDENT_AMBULATORY_CARE_PROVIDER_SITE_OTHER): Payer: Medicare Other | Admitting: *Deleted

## 2016-05-01 DIAGNOSIS — Z8673 Personal history of transient ischemic attack (TIA), and cerebral infarction without residual deficits: Secondary | ICD-10-CM

## 2016-05-01 DIAGNOSIS — I6350 Cerebral infarction due to unspecified occlusion or stenosis of unspecified cerebral artery: Secondary | ICD-10-CM | POA: Diagnosis not present

## 2016-05-01 DIAGNOSIS — Z7901 Long term (current) use of anticoagulants: Secondary | ICD-10-CM

## 2016-05-01 DIAGNOSIS — I4891 Unspecified atrial fibrillation: Secondary | ICD-10-CM | POA: Diagnosis not present

## 2016-05-01 LAB — POCT INR: INR: 2.5

## 2016-05-06 ENCOUNTER — Other Ambulatory Visit: Payer: Self-pay | Admitting: Cardiovascular Disease

## 2016-05-21 ENCOUNTER — Other Ambulatory Visit: Payer: Self-pay | Admitting: Cardiovascular Disease

## 2016-05-22 ENCOUNTER — Ambulatory Visit (INDEPENDENT_AMBULATORY_CARE_PROVIDER_SITE_OTHER): Payer: Medicare Other | Admitting: *Deleted

## 2016-05-22 DIAGNOSIS — Z8673 Personal history of transient ischemic attack (TIA), and cerebral infarction without residual deficits: Secondary | ICD-10-CM | POA: Diagnosis not present

## 2016-05-22 DIAGNOSIS — Z7901 Long term (current) use of anticoagulants: Secondary | ICD-10-CM

## 2016-05-22 DIAGNOSIS — I6350 Cerebral infarction due to unspecified occlusion or stenosis of unspecified cerebral artery: Secondary | ICD-10-CM

## 2016-05-22 DIAGNOSIS — I4891 Unspecified atrial fibrillation: Secondary | ICD-10-CM | POA: Diagnosis not present

## 2016-05-22 LAB — POCT INR: INR: 2.2

## 2016-06-10 ENCOUNTER — Ambulatory Visit: Payer: Medicare Other | Admitting: Cardiovascular Disease

## 2016-06-18 ENCOUNTER — Ambulatory Visit: Payer: Self-pay | Admitting: Cardiovascular Disease

## 2016-06-18 DIAGNOSIS — Z7901 Long term (current) use of anticoagulants: Secondary | ICD-10-CM

## 2016-06-18 DIAGNOSIS — Z8673 Personal history of transient ischemic attack (TIA), and cerebral infarction without residual deficits: Secondary | ICD-10-CM

## 2016-06-19 DEATH — deceased

## 2016-07-21 ENCOUNTER — Ambulatory Visit: Payer: Medicare Other | Admitting: Podiatry
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
